# Patient Record
Sex: Female | Born: 1935 | Race: White | Hispanic: No | Marital: Married | Smoking: Never smoker
Health system: Southern US, Community
[De-identification: ages and names within clinical notes are randomized; demographics above are authoritative.]

## PROBLEM LIST (undated history)

## (undated) DIAGNOSIS — H269 Unspecified cataract: Secondary | ICD-10-CM

## (undated) HISTORY — PX: ABDOMINAL HYSTERECTOMY: SHX81

## (undated) HISTORY — DX: Unspecified cataract: H26.9

## (undated) HISTORY — PX: CHOLECYSTECTOMY: SHX55

## (undated) HISTORY — PX: APPENDECTOMY: SHX54

---

## 2000-11-18 ENCOUNTER — Other Ambulatory Visit: Admission: RE | Admit: 2000-11-18 | Discharge: 2000-11-18 | Payer: Self-pay | Admitting: Obstetrics and Gynecology

## 2001-01-04 ENCOUNTER — Encounter: Admission: RE | Admit: 2001-01-04 | Discharge: 2001-01-20 | Payer: Self-pay | Admitting: Specialist

## 2001-01-21 ENCOUNTER — Encounter: Admission: RE | Admit: 2001-01-21 | Discharge: 2001-04-21 | Payer: Self-pay | Admitting: Specialist

## 2001-12-23 ENCOUNTER — Encounter: Payer: Self-pay | Admitting: Interventional Cardiology

## 2001-12-23 ENCOUNTER — Inpatient Hospital Stay (HOSPITAL_COMMUNITY): Admission: EM | Admit: 2001-12-23 | Discharge: 2001-12-23 | Payer: Self-pay | Admitting: Emergency Medicine

## 2004-05-20 ENCOUNTER — Ambulatory Visit: Payer: Self-pay | Admitting: Family Medicine

## 2004-07-03 ENCOUNTER — Ambulatory Visit: Payer: Self-pay | Admitting: Family Medicine

## 2004-09-09 ENCOUNTER — Ambulatory Visit: Payer: Self-pay | Admitting: Family Medicine

## 2004-10-03 ENCOUNTER — Ambulatory Visit: Payer: Self-pay | Admitting: Family Medicine

## 2004-11-21 ENCOUNTER — Ambulatory Visit: Payer: Self-pay | Admitting: Family Medicine

## 2005-01-06 ENCOUNTER — Ambulatory Visit: Payer: Self-pay | Admitting: Family Medicine

## 2005-03-10 ENCOUNTER — Ambulatory Visit: Payer: Self-pay | Admitting: Family Medicine

## 2005-03-25 ENCOUNTER — Ambulatory Visit: Payer: Self-pay | Admitting: Family Medicine

## 2005-03-31 ENCOUNTER — Ambulatory Visit (HOSPITAL_COMMUNITY): Admission: RE | Admit: 2005-03-31 | Discharge: 2005-03-31 | Payer: Self-pay | Admitting: Family Medicine

## 2005-04-09 ENCOUNTER — Ambulatory Visit: Payer: Self-pay | Admitting: Family Medicine

## 2005-05-07 ENCOUNTER — Ambulatory Visit: Payer: Self-pay | Admitting: Family Medicine

## 2005-06-12 ENCOUNTER — Ambulatory Visit: Payer: Self-pay | Admitting: Family Medicine

## 2005-08-20 ENCOUNTER — Ambulatory Visit: Payer: Self-pay | Admitting: Family Medicine

## 2005-09-10 ENCOUNTER — Ambulatory Visit: Payer: Self-pay | Admitting: Family Medicine

## 2005-11-20 ENCOUNTER — Ambulatory Visit: Payer: Self-pay | Admitting: Family Medicine

## 2005-12-01 ENCOUNTER — Ambulatory Visit: Payer: Self-pay | Admitting: Family Medicine

## 2005-12-30 ENCOUNTER — Ambulatory Visit: Payer: Self-pay | Admitting: Family Medicine

## 2006-03-18 ENCOUNTER — Ambulatory Visit: Payer: Self-pay | Admitting: Family Medicine

## 2006-03-26 ENCOUNTER — Ambulatory Visit: Payer: Self-pay | Admitting: Family Medicine

## 2006-05-20 ENCOUNTER — Ambulatory Visit: Payer: Self-pay | Admitting: Family Medicine

## 2006-05-22 ENCOUNTER — Ambulatory Visit: Payer: Self-pay | Admitting: Family Medicine

## 2006-07-13 ENCOUNTER — Ambulatory Visit: Payer: Self-pay | Admitting: Family Medicine

## 2006-07-29 ENCOUNTER — Ambulatory Visit: Payer: Self-pay | Admitting: Family Medicine

## 2006-08-24 ENCOUNTER — Ambulatory Visit: Payer: Self-pay | Admitting: Family Medicine

## 2007-07-09 ENCOUNTER — Encounter: Admission: RE | Admit: 2007-07-09 | Discharge: 2007-07-09 | Payer: Self-pay | Admitting: Family Medicine

## 2009-01-09 IMAGING — CR DG FOOT COMPLETE 3+V*R*
3 series · 3 of 3 positions shown · non-contrast
Comparison: None.

RIGHT FOOT - 3  VIEW:

CLINICAL DATA: Right foot and ankle pain

[t foot ap right]
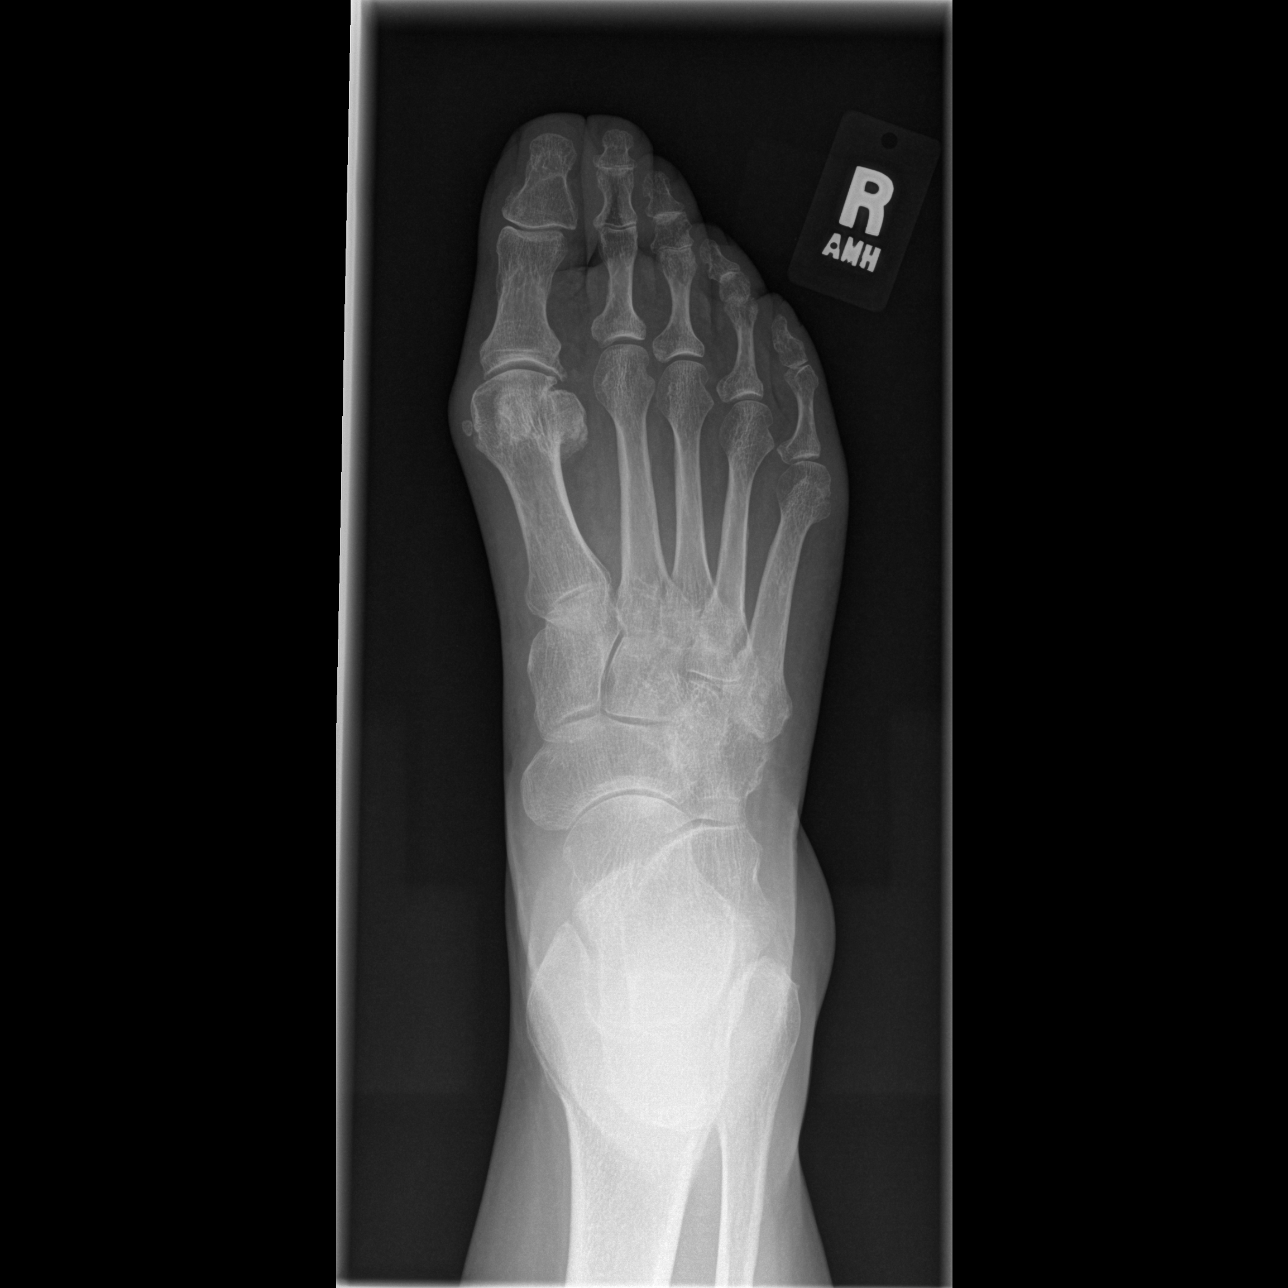

[t foot oblique right]
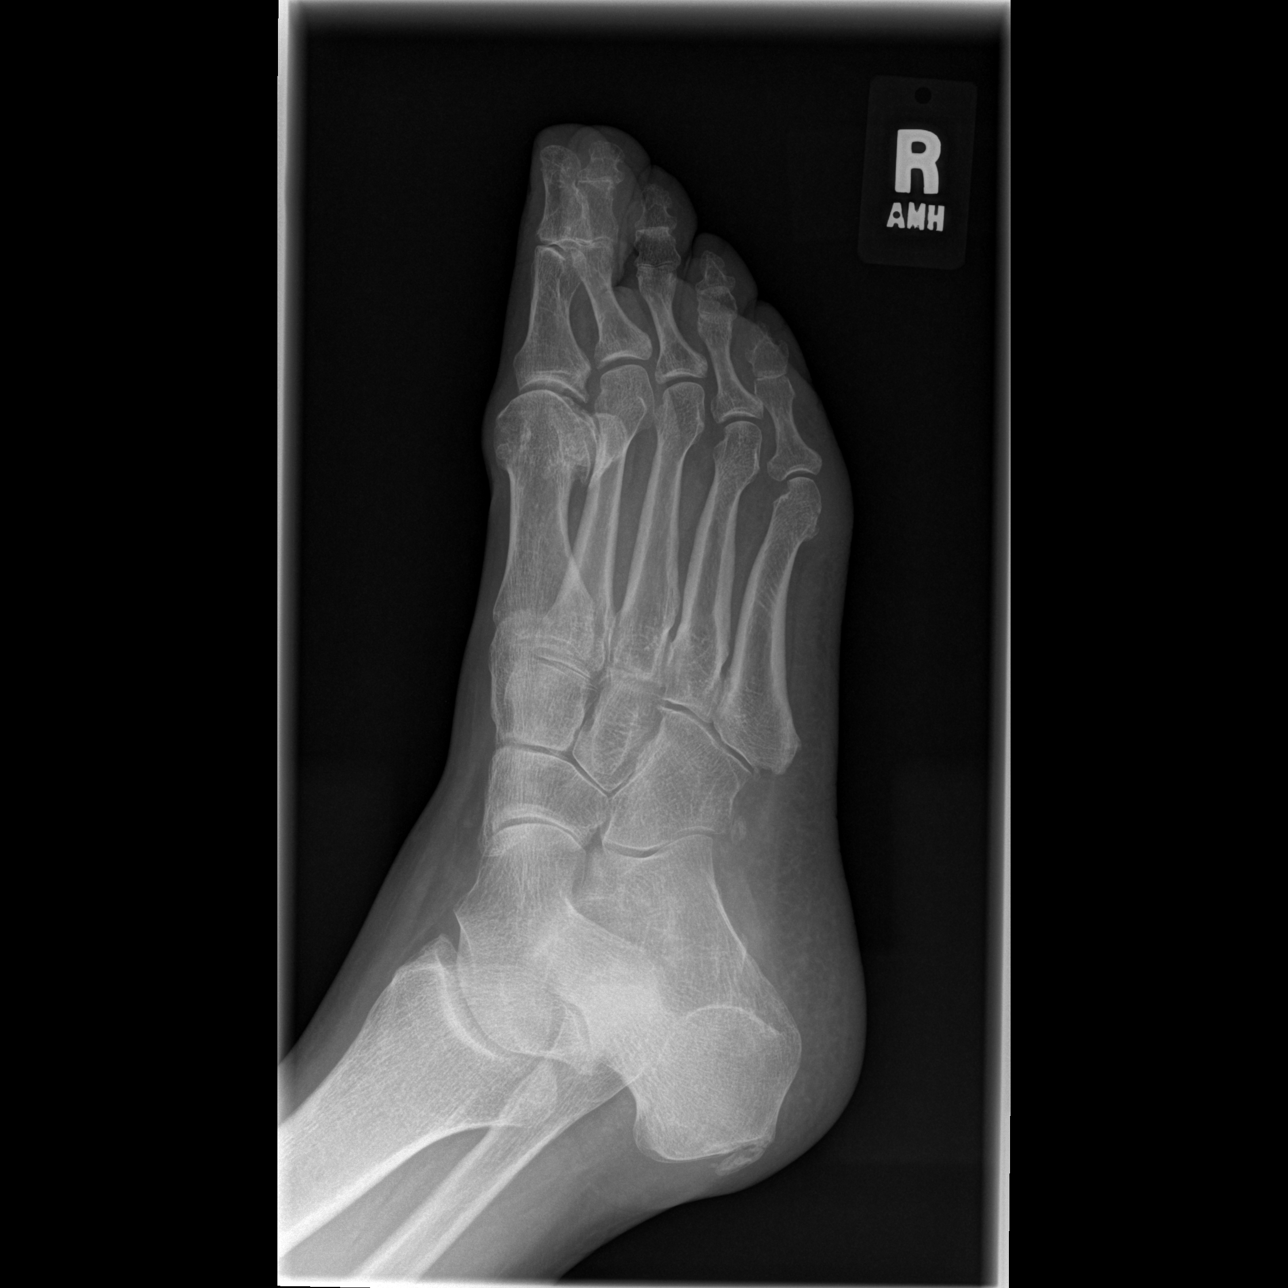

[t foot lat right]
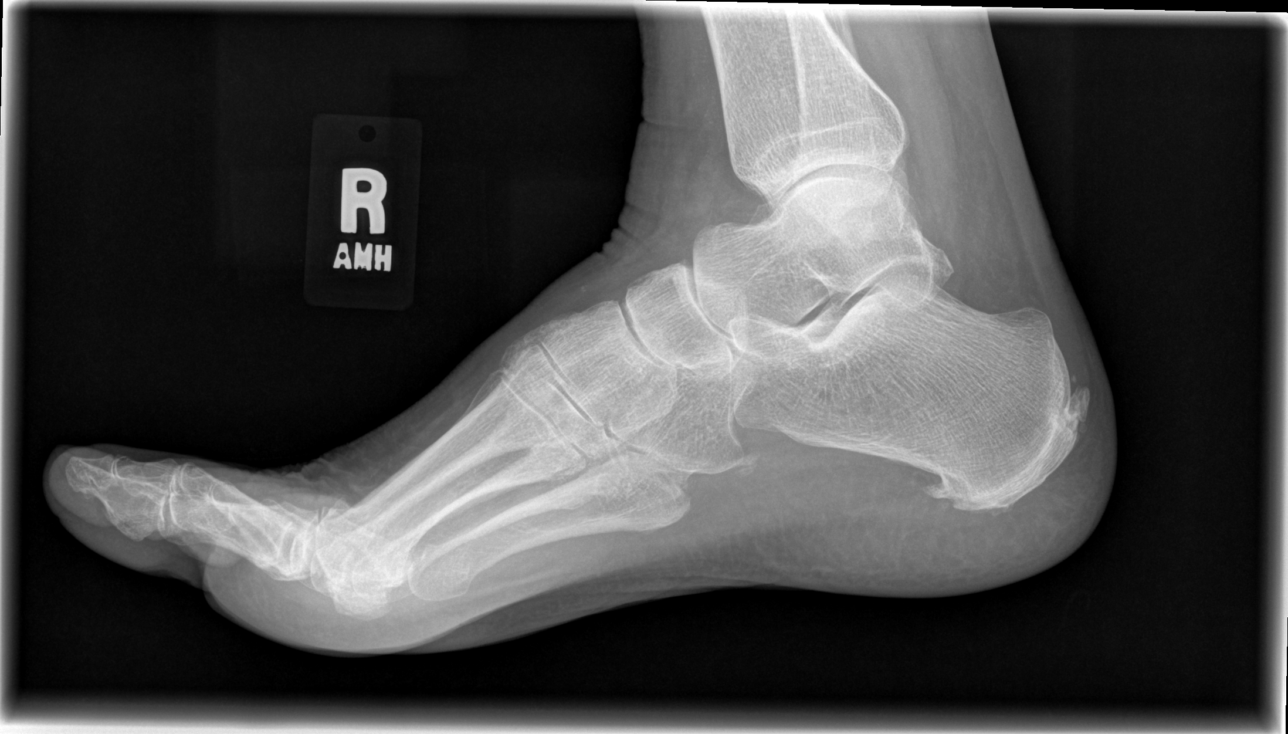

[3 of 3 positions shown; findings below may reference images not displayed]

FINDINGS: No evidence for an acute fracture. There is no dislocation. Bones are
diffusely demineralized. Hallux valgus deformity is evident and there are
degenerative changes at the first MTP joint. Small spurs extend from both the
plantar and Achilles aspects of the calcaneal tuberosity.
IMPRESSION: No acute bony abnormality.

## 2010-07-13 ENCOUNTER — Encounter: Payer: Self-pay | Admitting: Family Medicine

## 2010-11-08 NOTE — H&P (Signed)
[facility] - Inpatient  Patient:    Teresa Lowery, Teresa Lowery Visit Number: [id number] MRN: [id number]          Service Type: EMS Location: [facility] Attending Physician:  Hanley Seamen Dictated by:   Clovis Pu Patty Sermons, M.D. Admit Date:  12/22/2001   CC:         Darci Needle, M.D.  Colon Flattery, D.O., [place]   History and Physical  CHIEF COMPLAINT:  Uncontrolled high blood pressure and chest pain.  HISTORY:  This is a 76 year old, married, Caucasian woman admitted with chest pain and hypertension.  She was scheduled to see Dr. Verdis Prime for the first time on July 18 for what she describes as a "vascular workup."  The patient had an MRI approximately a week ago at [facility] and was told that she had "TIAs" and was started on Plavix.  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  She had a sensation of motion and vertigo. She had a blood pressure checked by a person at the blood mobile, and it was elevated in the range of 200/90.  She was advised to go to the emergency room which she did.  En route to the hospital, she developed substernal chest pain with nausea but no vomiting and no diaphoresis.  She has not had any significant dyspnea.  Of note is the fact that the patient was found to have high blood pressure about four years ago.  At that time, she was started on Covera HS 150 mg daily, and she has been on Covera HS since that time.  She has not tried other medicines. and up until now her blood pressure has been well controlled.  She does not give any history of exertional chest pain to suggest angina.  FAMILY HISTORY:  Her mother died of an MI in her 72s.  She has a brother who died at age 72 during a failed angioplasty attempt.  All of her maternal uncles have died in their 62s, 77s, or 56s of sudden cardiac death.  Her father died at age 34 of a pulmonary embolus after surgery.  SOCIAL HISTORY:  She is married.  She  has two children.  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  She has never smoked, and she does not drink any alcohol.  She avoids caffeine.  PRESENT MEDICATIONS: 1. Provera HS 180 mg daily. 2. Plavix 75 mg daily. 3. Allegra p.r.n. 4. Generic Tranxene p.r.n. 5. Premarin 0.625 mg daily.  ALLERGIES:  ASPIRIN, PREDNISONE, and SULFA.  PAST MEDICAL HISTORY:  She has had a hysterectomy, cholecystectomy, and tonsillectomy.  REVIEW OF SYSTEMS:  Gastrointestinal: Negative.  Genitourinary: Negative. Respiratory: No cough or bronchitis.  She does have arthritis in the hands. Neurologic: She has not had any headaches but has had recent vertigo. Endocrine: History reveals no diabetes or thyroid problems but has been told of slightly elevated cholesterol but not high enough to warrant drug treatment.  PHYSICAL EXAMINATION:  VITAL SIGNS:  Blood pressure 183/93, pulse 60 and regular, respirations normal.  HEENT:  Pupils are equal, round and reactive to light and accommodations. Extraocular movements are full.  She does have nystagmus to the right.  NECK:  Jugular venous pressure normal, carotids normal, thyroid normal.  CHEST:  Clear.  HEART:  Reveals a loud aortic closure sound.  There is no gallop, murmur, or rub.  ABDOMEN:  Soft without hepatosplenomegaly or masses.  There are no abdominal  bruits audible.  EXTREMITIES:  Trace edema.  There are good peripheral pulses.  NEUROLOGIC:  Nonfocal.  DIAGNOSTIC DATA:  Electrocardiogram showed normal sinus rhythm and is within normal limits.  Chest x-ray shows mild cardiomegaly and clear lungs.  Labs include normal CBC, normal troponin, normal CK-MB.  Sodium 138, potassium 3.4, blood sugar 107, BUN 11, creatinine 0.8.  Liver function studies normal.  IMPRESSION: 1. Chest pain, rule out myocardial infarction. 2. Uncontrolled hypertension. 3. Dizziness. 4. Recent diagnosis of transient ischemic attacks. 5. Postmenopausal on  Premarin.  DISPOSITION:  We are going to admit to telemetry.  Will give IV nitroglycerin to help with blood pressure control as well as treat the chest pain.  Will add a beta blocker, hydrochlorothiazide, and low-dose ACE inhibitor.   Wills top the Covera HS at this point.  We will keep her on a caffeine-free diet and consider a possible Cardiolite stress test versus catheterization in a.m. after Dr. Katrinka Blazing sees her. Dictated by:   Clovis Pu Patty Sermons, M.D. Attending Physician:  Hanley Seamen DD:  12/23/01 TD:  12/23/01 Job: [id number]

## 2013-02-20 ENCOUNTER — Ambulatory Visit (INDEPENDENT_AMBULATORY_CARE_PROVIDER_SITE_OTHER): Payer: Medicare Other | Admitting: Family Medicine

## 2013-02-20 ENCOUNTER — Other Ambulatory Visit: Payer: Self-pay | Admitting: *Deleted

## 2013-02-20 VITALS — BP 128/68 | HR 68 | Temp 97.7°F | Resp 18 | Ht 65.5 in | Wt 177.0 lb

## 2013-02-20 DIAGNOSIS — N390 Urinary tract infection, site not specified: Secondary | ICD-10-CM

## 2013-02-20 DIAGNOSIS — R109 Unspecified abdominal pain: Secondary | ICD-10-CM

## 2013-02-20 DIAGNOSIS — H8110 Benign paroxysmal vertigo, unspecified ear: Secondary | ICD-10-CM

## 2013-02-20 LAB — POCT URINALYSIS DIPSTICK
Bilirubin, UA: NEGATIVE
Glucose, UA: NEGATIVE
Ketones, UA: NEGATIVE
Nitrite, UA: NEGATIVE
Protein, UA: NEGATIVE
Spec Grav, UA: 1.015
Urobilinogen, UA: 0.2
pH, UA: 5.5

## 2013-02-20 LAB — POCT UA - MICROSCOPIC ONLY
Casts, Ur, LPF, POC: NEGATIVE
Crystals, Ur, HPF, POC: NEGATIVE
Renal tubular cells: POSITIVE
Yeast, UA: NEGATIVE

## 2013-02-20 LAB — POCT CBC
Granulocyte percent: 56.8 %G (ref 37–80)
HCT, POC: 43.6 % (ref 37.7–47.9)
Hemoglobin: 13.9 g/dL (ref 12.2–16.2)
Lymph, poc: 2.5 (ref 0.6–3.4)
MCH, POC: 27.5 pg (ref 27–31.2)
MCHC: 31.9 g/dL (ref 31.8–35.4)
MCV: 86.1 fL (ref 80–97)
MID (cbc): 0.5 (ref 0–0.9)
MPV: 9 fL (ref 0–99.8)
POC Granulocyte: 3.9 (ref 2–6.9)
POC LYMPH PERCENT: 36 %L (ref 10–50)
POC MID %: 7.2 %M (ref 0–12)
Platelet Count, POC: 186 10*3/uL (ref 142–424)
RBC: 5.06 M/uL (ref 4.04–5.48)
RDW, POC: 14.8 %
WBC: 6.9 10*3/uL (ref 4.6–10.2)

## 2013-02-20 MED ORDER — MECLIZINE HCL 25 MG PO TABS: 25.0000 mg | ORAL_TABLET | Freq: Three times a day (TID) | ORAL | Status: AC | PRN

## 2013-02-20 MED ORDER — SULFAMETHOXAZOLE-TMP DS 800-160 MG PO TABS: 1.0000 | ORAL_TABLET | Freq: Two times a day (BID) | ORAL | Status: DC

## 2013-02-20 MED ORDER — NITROFURANTOIN MONOHYD MACRO 100 MG PO CAPS: 100.0000 mg | ORAL_CAPSULE | Freq: Two times a day (BID) | ORAL | Status: DC

## 2013-02-20 NOTE — Patient Instructions (Addendum)
Urinary Tract Infection Urinary tract infections (UTIs) can develop anywhere along your urinary tract. Your urinary tract is your body's drainage system for removing wastes and extra water. Your urinary tract includes two kidneys, two ureters, a bladder, and a urethra. Your kidneys are a pair of bean-shaped organs. Each kidney is about the size of your fist. They are located below your ribs, one on each side of your spine. CAUSES Infections are caused by microbes, which are microscopic organisms, including fungi, viruses, and bacteria. These organisms are so small that they can only be seen through a microscope. Bacteria are the microbes that most commonly cause UTIs. SYMPTOMS  Symptoms of UTIs may vary by age and gender of the patient and by the location of the infection. Symptoms in young women typically include a frequent and intense urge to urinate and a painful, burning feeling in the bladder or urethra during urination. Older women and men are more likely to be tired, shaky, and weak and have muscle aches and abdominal pain. A fever may mean the infection is in your kidneys. Other symptoms of a kidney infection include pain in your back or sides below the ribs, nausea, and vomiting. DIAGNOSIS To diagnose a UTI, your caregiver will ask you about your symptoms. Your caregiver also will ask to provide a urine sample. The urine sample will be tested for bacteria and white blood cells. White blood cells are made by your body to help fight infection. TREATMENT  Typically, UTIs can be treated with medication. Because most UTIs are caused by a bacterial infection, they usually can be treated with the use of antibiotics. The choice of antibiotic and length of treatment depend on your symptoms and the type of bacteria causing your infection. HOME CARE INSTRUCTIONS  If you were prescribed antibiotics, take them exactly as your caregiver instructs you. Finish the medication even if you feel better after you  have only taken some of the medication.  Drink enough water and fluids to keep your urine clear or pale yellow.  Avoid caffeine, tea, and carbonated beverages. They tend to irritate your bladder.  Empty your bladder often. Avoid holding urine for long periods of time.  Empty your bladder before and after sexual intercourse.  After a bowel movement, women should cleanse from front to back. Use each tissue only once. SEEK MEDICAL CARE IF:   You have back pain.  You develop a fever.  Your symptoms do not begin to resolve within 3 days. SEEK IMMEDIATE MEDICAL CARE IF:   You have severe back pain or lower abdominal pain.  You develop chills.  You have nausea or vomiting.  You have continued burning or discomfort with urination. MAKE SURE YOU:   Understand these instructions.  Will watch your condition.  Will get help right away if you are not doing well or get worse. Document Released: 03/19/2005 Document Revised: 12/09/2011 Document Reviewed: 07/18/2011 [facility] Patient Information 2014 [place], [place]. Benign Positional Vertigo Vertigo means you feel like you or your surroundings are moving when they are not. Benign positional vertigo is the most common form of vertigo. Benign means that the cause of your condition is not serious. Benign positional vertigo is more common in older adults. CAUSES  Benign positional vertigo is the result of an upset in the labyrinth system. This is an area in the middle ear that helps control your balance. This may be caused by a viral infection, head injury, or repetitive motion. However, often no specific cause is found.  SYMPTOMS  Symptoms of benign positional vertigo occur when you move your head or eyes in different directions. Some of the symptoms may include:  Loss of balance and falls.  Vomiting.  Blurred vision.  Dizziness.  Nausea.  Involuntary eye movements (nystagmus). DIAGNOSIS  Benign positional vertigo is usually  diagnosed by physical exam. If the specific cause of your benign positional vertigo is unknown, your caregiver may perform imaging tests, such as magnetic resonance imaging (MRI) or computed tomography (CT). TREATMENT  Your caregiver may recommend movements or procedures to correct the benign positional vertigo. Medicines such as meclizine, benzodiazepines, and medicines for nausea may be used to treat your symptoms. In rare cases, if your symptoms are caused by certain conditions that affect the inner ear, you may need surgery. HOME CARE INSTRUCTIONS   Follow your caregiver's instructions.  Move slowly. Do not make sudden body or head movements.  Avoid driving.  Avoid operating heavy machinery.  Avoid performing any tasks that would be dangerous to you or others during a vertigo episode.  Drink enough fluids to keep your urine clear or pale yellow. SEEK IMMEDIATE MEDICAL CARE IF:   You develop problems with walking, weakness, numbness, or using your arms, hands, or legs.  You have difficulty speaking.  You develop severe headaches.  Your nausea or vomiting continues or gets worse.  You develop visual changes.  Your family or friends notice any behavioral changes.  Your condition gets worse.  You have a fever.  You develop a stiff neck or sensitivity to light. MAKE SURE YOU:   Understand these instructions.  Will watch your condition.  Will get help right away if you are not doing well or get worse. Document Released: 03/17/2006 Document Revised: 09/01/2011 Document Reviewed: 02/27/2011 [facility] Patient Information 2014 [place], [place].

## 2013-02-20 NOTE — Progress Notes (Signed)
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  No prior h/o vertigo.  No fever.  No significant loss of hearing.    Patient also has lower abdominal pain several months.  She feels like some swelling RLQ.  She's had a colonoscopy within 10 years.  Associated:  Nausea  Objective:  NAD HEENT:  Small amount of fluid right TM. Chest:  Clear Heart:  Reg, no murmur Neck:  No bruit or adenopathy Abdomen:  Tender both lower quadrants with slight rebound, soft, no mass Ext:  Good pulses, no edema Skin:  No rash Results for orders placed in visit on 02/20/13  POCT CBC      Result Value Range   WBC 6.9  4.6 - 10.2 K/uL   Lymph, poc 2.5  0.6 - 3.4   POC LYMPH PERCENT 36.0  10 - 50 %L   MID (cbc) 0.5  0 - 0.9   POC MID % 7.2  0 - 12 %M   POC Granulocyte 3.9  2 - 6.9   Granulocyte percent 56.8  37 - 80 %G   RBC 5.06  4.04 - 5.48 M/uL   Hemoglobin 13.9  12.2 - 16.2 g/dL   HCT, POC 40.9  81.1 - 47.9 %   MCV 86.1  80 - 97 fL   MCH, POC 27.5  27 - 31.2 pg   MCHC 31.9  31.8 - 35.4 g/dL   RDW, POC 91.4     Platelet Count, POC 186  142 - 424 K/uL   MPV 9.0  0 - 99.8 fL  POCT UA - MICROSCOPIC ONLY      Result Value Range   WBC, Ur, HPF, POC 7-19     RBC, urine, microscopic 4-12     Bacteria, U Microscopic 1+     Mucus, UA trace     Epithelial cells, urine per micros 5-21     Crystals, Ur, HPF, POC neg     Casts, Ur, LPF, POC neg     Yeast, UA neg     Renal tubular cells pos    POCT URINALYSIS DIPSTICK      Result Value Range   Color, UA yellow     Clarity, UA clear     Glucose, UA neg     Bilirubin, UA neg     Ketones, UA neg     Spec Grav, UA 1.015     Blood, UA small     pH, UA 5.5     Protein, UA neg     Urobilinogen, UA 0.2     Nitrite, UA neg     Leukocytes, UA small (1+)     Abdominal  pain, other specified site - Plan: POCT CBC, POCT UA - Microscopic Only, POCT urinalysis dipstick, DISCONTINUED:  sulfamethoxazole-trimethoprim (BACTRIM DS) 800-160 MG per tablet  UTI (lower urinary tract infection) - Plan: Urine culture, DISCONTINUED: sulfamethoxazole-trimethoprim (BACTRIM DS) 800-160 MG per tablet  Benign paroxysmal positional vertigo - Plan: meclizine (ANTIVERT) 25 MG tablet  Signed, Elvina Sidle, MD

## 2013-02-21 LAB — URINE CULTURE
Colony Count: NO GROWTH
Organism ID, Bacteria: NO GROWTH

## 2013-03-05 ENCOUNTER — Ambulatory Visit (INDEPENDENT_AMBULATORY_CARE_PROVIDER_SITE_OTHER): Payer: Medicare Other | Admitting: Family Medicine

## 2013-03-05 VITALS — BP 128/72 | HR 60 | Temp 97.6°F | Resp 16 | Wt 182.4 lb

## 2013-03-05 DIAGNOSIS — N760 Acute vaginitis: Secondary | ICD-10-CM

## 2013-03-05 DIAGNOSIS — R300 Dysuria: Secondary | ICD-10-CM

## 2013-03-05 DIAGNOSIS — B9689 Other specified bacterial agents as the cause of diseases classified elsewhere: Secondary | ICD-10-CM

## 2013-03-05 DIAGNOSIS — N898 Other specified noninflammatory disorders of vagina: Secondary | ICD-10-CM

## 2013-03-05 DIAGNOSIS — R1031 Right lower quadrant pain: Secondary | ICD-10-CM

## 2013-03-05 LAB — POCT UA - MICROSCOPIC ONLY
Casts, Ur, LPF, POC: NEGATIVE
Crystals, Ur, HPF, POC: NEGATIVE
Mucus, UA: POSITIVE
Yeast, UA: NEGATIVE

## 2013-03-05 LAB — POCT URINALYSIS DIPSTICK
Bilirubin, UA: NEGATIVE
Glucose, UA: NEGATIVE
Ketones, UA: NEGATIVE
Nitrite, UA: NEGATIVE
Protein, UA: NEGATIVE
Spec Grav, UA: 1.02
Urobilinogen, UA: 0.2
pH, UA: 7

## 2013-03-05 LAB — POCT WET PREP WITH KOH
KOH Prep POC: NEGATIVE
Trichomonas, UA: NEGATIVE
Yeast Wet Prep HPF POC: NEGATIVE

## 2013-03-05 MED ORDER — METRONIDAZOLE 500 MG PO TABS: 500.0000 mg | ORAL_TABLET | Freq: Two times a day (BID) | ORAL | Status: DC

## 2013-03-05 MED ORDER — FLUCONAZOLE 150 MG PO TABS: 150.0000 mg | ORAL_TABLET | Freq: Once | ORAL | Status: DC

## 2013-03-05 NOTE — Progress Notes (Signed)
[facility]:  Office Visit  Chief Complaint:  Chief Complaint  Patient presents with  . Fatigue  . Abdominal Pain    LLQ- uncomfortable  . Hip Pain  . Back Pain  . Vaginitis    HPI: Teresa Lowery is a 78 y.o. female who complains of   1. Lower right sided abd and pelvic  "discomfort, not pain" for 5-6 months, feels nauseated all the time; she denies vomiting, , denies fevers, chills, night sweats, weight loss. She has had cholecystectomy and also appendectomy. She has had hysterectomy. Denies diabetes. She has had flatus and also regular  BMs. Denies skn changes/rash/viral illness. She has had dysuria nd vaginal itching. 2. Vaginal itching, and raw, and burning and stinging and has been going on "forever" greater than 3-4 months. Denies STDs.  Past Medical History  Diagnosis Date  . Cataract    Past Surgical History  Procedure Laterality Date  . Abdominal hysterectomy    . Cholecystectomy    . Appendectomy     History   Social History  . Marital Status: Married    Spouse Name: N/A    Number of Children: N/A  . Years of Education: N/A   Social History Main Topics  . Smoking status: Never Smoker   . Smokeless tobacco: Never Used  . Alcohol Use: None  . Drug Use: None  . Sexual Activity: None   Other Topics Concern  . None   Social History Narrative  . None   No family history on file. Allergies  Allergen Reactions  . Aspirin   . Penicillins   . Sulfa Antibiotics    Prior to Admission medications   Medication Sig Start Date End Date Taking? Authorizing Provider  clorazepate (TRANXENE) 7.5 MG tablet Take 7.5 mg by mouth daily.   Yes Historical Provider, MD  cyanocobalamin (,VITAMIN B-12,) 1000 MCG/ML injection Inject 1,000 mcg into the muscle every 30 (thirty) days.   Yes Historical Provider, MD  olmesartan-hydrochlorothiazide (BENICAR HCT) 20-12.5 MG per tablet Take 1 tablet by mouth daily.   Yes Historical Provider, MD  potassium  chloride (KLOR-CON) 20 MEQ packet Take 20 mEq by mouth daily.   Yes Historical Provider, MD  meclizine (ANTIVERT) 25 MG tablet Take 1 tablet (25 mg total) by mouth 3 (three) times daily as needed. 02/20/13   Elvina Sidle, MD  nitrofurantoin, macrocrystal-monohydrate, (MACROBID) 100 MG capsule Take 1 capsule (100 mg total) by mouth 2 (two) times daily. For 7 days 02/20/13   Elvina Sidle, MD  Vitamin D, Ergocalciferol, (DRISDOL) 50000 UNITS CAPS capsule Take 50,000 Units by mouth every 7 (seven) days.    Historical Provider, MD     ROS: The patient denies fevers, chills, night sweats, unintentional weight loss, chest pain, palpitations, wheezing, dyspnea on exertion, nausea, vomiting,  hematuria, melena, numbness, weakness, or tingling.   All other systems have been reviewed and were otherwise negative with the exception of those mentioned in the HPI and as above.    PHYSICAL EXAM: Filed Vitals:   03/05/13 1429  BP: 128/72  Pulse: 60  Temp: 97.6 F (36.4 C)  Resp: 16   Filed Vitals:   03/05/13 1429  Weight: 182 lb 6.4 oz (82.736 kg)   Body mass index is 29.88 kg/(m^2).  General: Alert, no acute distress HEENT:  Normocephalic, atraumatic, oropharynx patent. EOMI, PERRLA Cardiovascular:  Regular rate and rhythm, no rubs murmurs or gallops.  No Carotid bruits, radial pulse intact. No pedal edema.  Respiratory: Clear to auscultation bilaterally.  No wheezes, rales, or rhonchi.  No cyanosis, no use of accessory musculature GI: No organomegaly, abdomen is soft and diffusely -tender R abd mid and Lower right pelvic area, positive bowel sounds.  No masses. Skin: No rashes. Neurologic: Facial musculature symmetric. Psychiatric: Patient is appropriate throughout our interaction. Lymphatic: No cervical lymphadenopathy Musculoskeletal: Gait intact. Vaginal exam-no masses,lesions, + thick white dc  LABS: Results for orders placed in visit on 03/05/13  POCT UA - MICROSCOPIC ONLY       Result Value Range   WBC, Ur, HPF, POC 1-2     RBC, urine, microscopic 8-10     Bacteria, U Microscopic 1+     Mucus, UA pos     Epithelial cells, urine per micros 5-6     Crystals, Ur, HPF, POC neg     Casts, Ur, LPF, POC neg     Yeast, UA neg    POCT URINALYSIS DIPSTICK      Result Value Range   Color, UA yellow     Clarity, UA clear     Glucose, UA neg     Bilirubin, UA neg     Ketones, UA neg     Spec Grav, UA 1.020     Blood, UA mod     pH, UA 7.0     Protein, UA neg     Urobilinogen, UA 0.2     Nitrite, UA neg     Leukocytes, UA Trace    POCT WET PREP WITH KOH      Result Value Range   Trichomonas, UA Negative     Clue Cells Wet Prep HPF POC 18-30     Epithelial Wet Prep HPF POC 4-10     Yeast Wet Prep HPF POC neg     Bacteria Wet Prep HPF POC 2+     RBC Wet Prep HPF POC 1-4     WBC Wet Prep HPF POC 3-5     KOH Prep POC Negative       EKG/XRAY:   Primary read interpreted by Dr. Conley Rolls at [facility].   ASSESSMENT/PLAN: Encounter Diagnoses  Name Primary?  . Vaginitis and vulvovaginitis Yes  . Bacterial vaginitis   . Vaginal discharge   . Dysuria   . Abdominal pain, lower, right    Will get abd [place] since she has been having sxs for last 6 months Rx Flagyl, Diflucan for vaginitis at this time F/u prn Gross sideeffects, risk and benefits, and alternatives of medications d/w patient. Patient is aware that all medications have potential sideeffects and we are unable to predict every sideeffect or drug-drug interaction that may occur.  Hamilton Capri PHUONG, DO 03/05/2013 4:33 PM

## 2013-03-05 NOTE — Patient Instructions (Addendum)
Vaginitis  Vaginitis in a soreness, swelling and redness (inflammation) of the vagina and vulva. This is not a sexually transmitted infection.   CAUSES   Yeast vaginitis is caused by yeast (candida) that is normally found in your vagina. With a yeast infection, the candida has over grown in number to a point that upsets the chemical balance.  SYMPTOMS    White thick vaginal discharge.   Swelling, itching, redness and irritation of the vagina and possibly the lips of the vagina (vulva).   Burning or painful urination.   Painful intercourse.  HOME CARE INSTRUCTIONS    Finish all medication as prescribed.   Do not have sex until treatment is completed or instructed by your healthcare giver.   Take warm sitz baths.   Do not douche.   Do not use tampons, especially scented ones.   Wear cotton underwear.   Avoid tight pants and panty hose.   Tell your sexual partner that you have a yeast infection. They should go to their caregiver if they have symptoms such as mild rash or itching.   Your sexual partner should be treated if your infection is difficult to eliminate.   Practice safer sex. Use condoms.   Some vaginal medications cause latex condoms to fail. Ask your caregiver this.  SEEK MEDICAL CARE IF:    You develop a fever.   The infection is getting worse after 2 days of treatment.   The infection is not getting better after 3 days of treatment.   You develop blisters in or around your vagina.   You develop vaginal bleeding, and it is not your menstrual period.   You have pain when you urinate.   You develop intestinal problems.   You have pain with sexual intercourse.  Document Released: 07/17/2004 Document Revised: 05/29/2011 Document Reviewed: 02/22/2009  [employer] Patient Information 2012 [employer].

## 2013-03-07 LAB — URINE CULTURE: Colony Count: 15000

## 2013-03-10 ENCOUNTER — Other Ambulatory Visit: Payer: Self-pay | Admitting: Radiology

## 2013-03-10 DIAGNOSIS — R102 Pelvic and perineal pain: Secondary | ICD-10-CM

## 2013-03-17 ENCOUNTER — Ambulatory Visit
Admission: RE | Admit: 2013-03-17 | Discharge: 2013-03-17 | Disposition: A | Discharge status: Home / Self Care | Payer: Self-pay | Source: Ambulatory Visit | Attending: Family Medicine | Admitting: Family Medicine

## 2013-03-17 ENCOUNTER — Ambulatory Visit
Admission: RE | Admit: 2013-03-17 | Discharge: 2013-03-17 | Disposition: A | Discharge status: Home / Self Care | Payer: Medicare Other | Source: Ambulatory Visit | Attending: Family Medicine | Admitting: Family Medicine

## 2013-03-17 ENCOUNTER — Telehealth: Payer: Self-pay | Admitting: Family Medicine

## 2013-03-17 DIAGNOSIS — R102 Pelvic and perineal pain: Secondary | ICD-10-CM

## 2013-03-17 NOTE — Telephone Encounter (Signed)
Spoke to patient about [place] results, she will f/u with PCP, I will send her all test results and imaging studies.

## 2013-03-21 ENCOUNTER — Telehealth: Payer: Self-pay | Admitting: Radiology

## 2013-03-21 NOTE — Telephone Encounter (Signed)
Patient has requested copies of imaging studies, she will have to get these from [facility]. I have called her to advise.

## 2013-03-22 ENCOUNTER — Telehealth: Payer: Self-pay

## 2013-03-22 NOTE — Telephone Encounter (Signed)
PT STATES SHE THINK AMY MAY HAVE CALLED HER YESTERDAY AND SHE IS RETURNING THE CALL. WILL BE HOME UNTIL 2:00. PLEASE CALL [phone]

## 2013-03-22 NOTE — Telephone Encounter (Signed)
Called her again to advise.

## 2014-01-20 ENCOUNTER — Encounter: Payer: Self-pay | Admitting: Gastroenterology

## 2014-09-18 IMAGING — US US ABDOMEN COMPLETE
1 series · 13 of 25 positions shown · non-contrast
Comparison: CT report 07/22/2006 no images available

CLINICAL DATA: Abdominal and pelvic pain for 6 months, status post
cholecystectomy

EXAM:
ABDOMEN ULTRASOUND

[Series 1: us abdomen complete · 0.27mm/px · 13 of 75 slices shown]
[im 1/75]
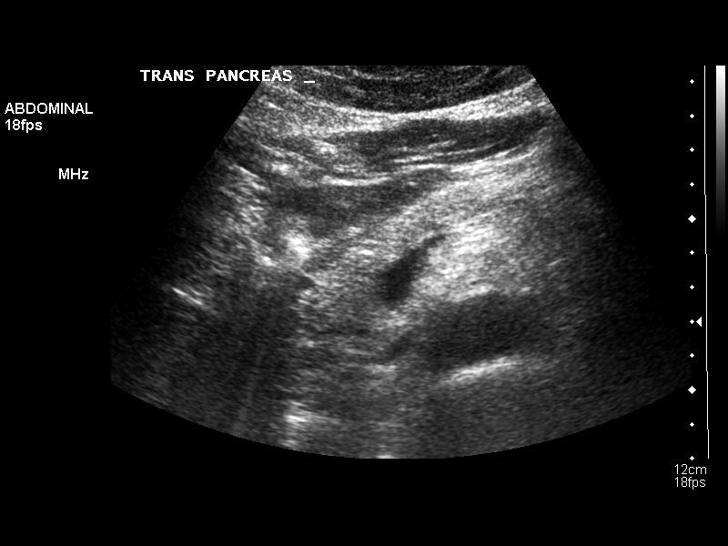
[im 7/75]
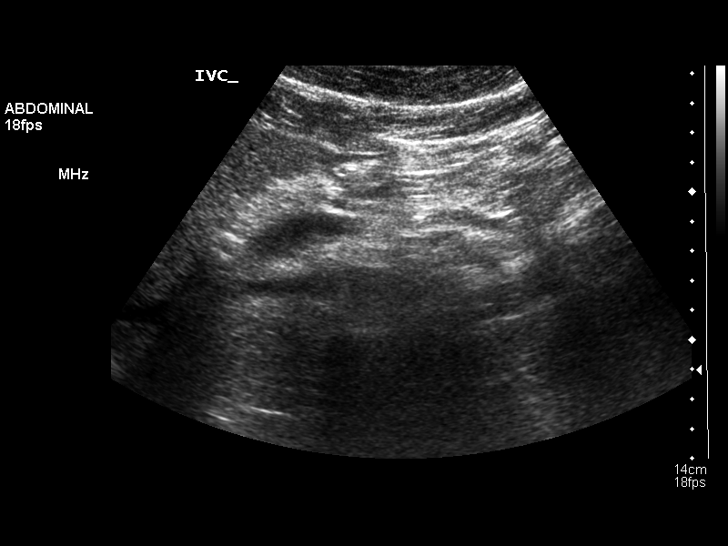
[im 13/75]
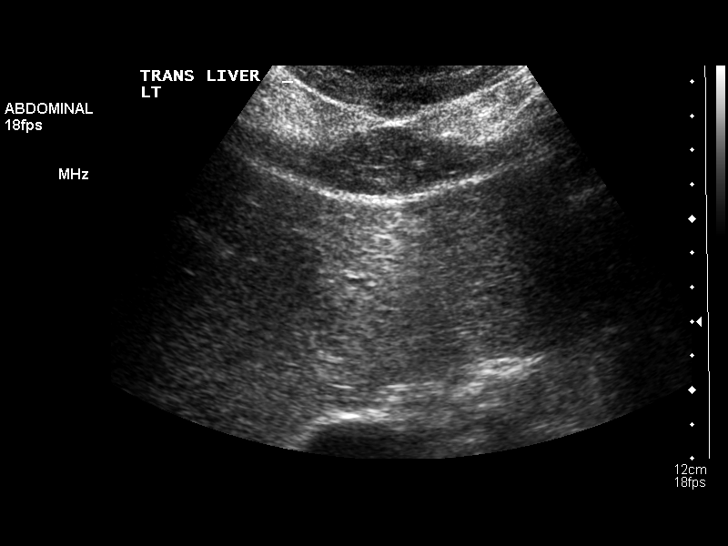
[im 19/75]
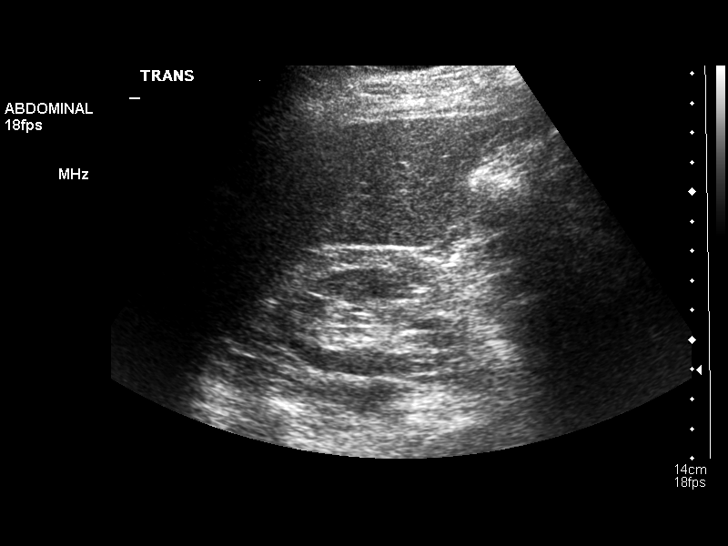
[im 25/75]
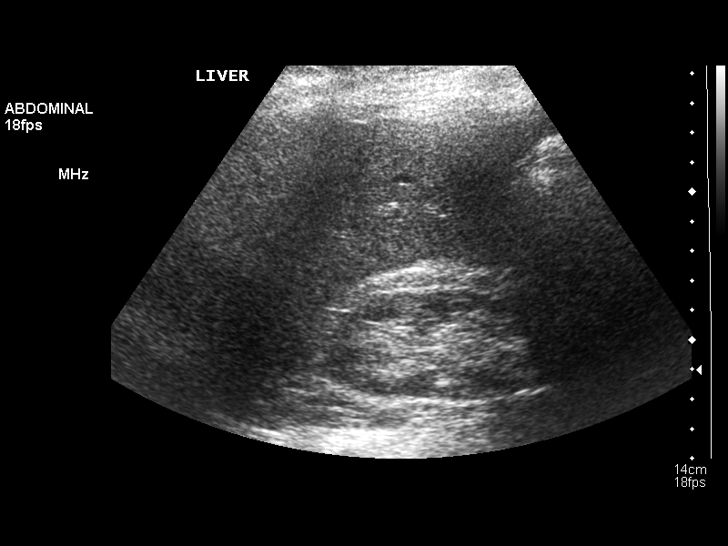
[im 31/75]
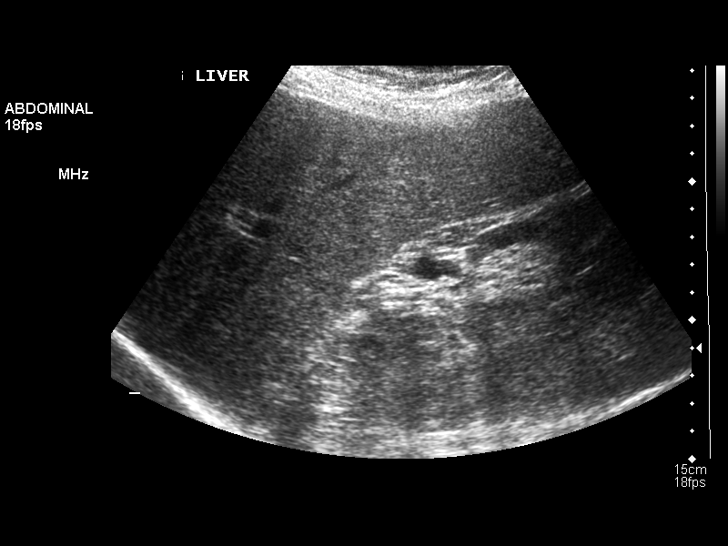
[im 38/75]
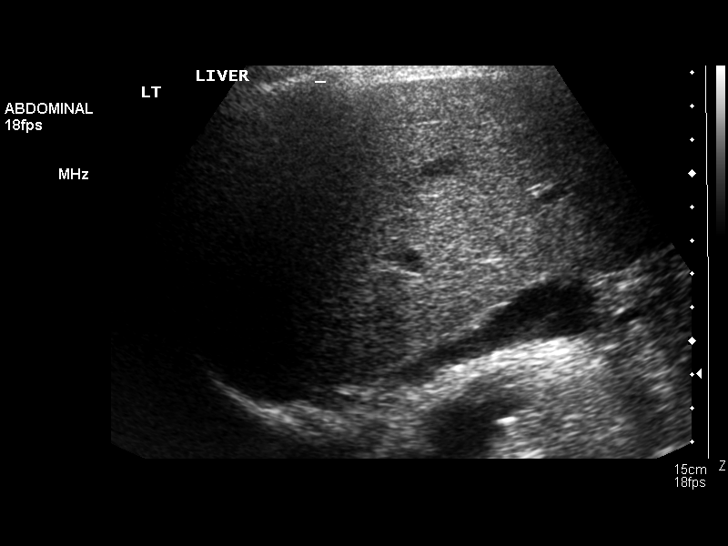
[im 44/75]
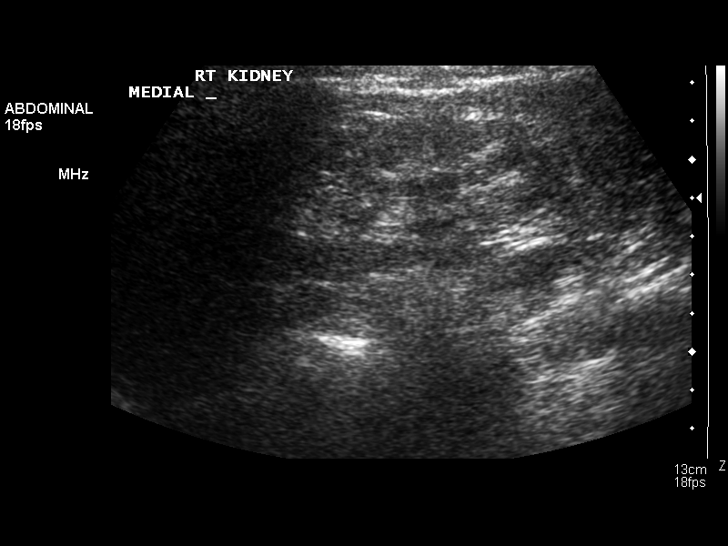
[im 50/75]
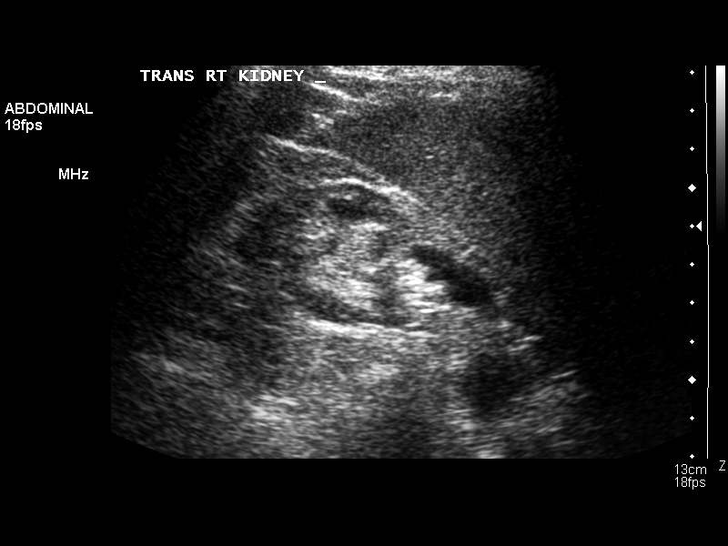
[im 56/75]
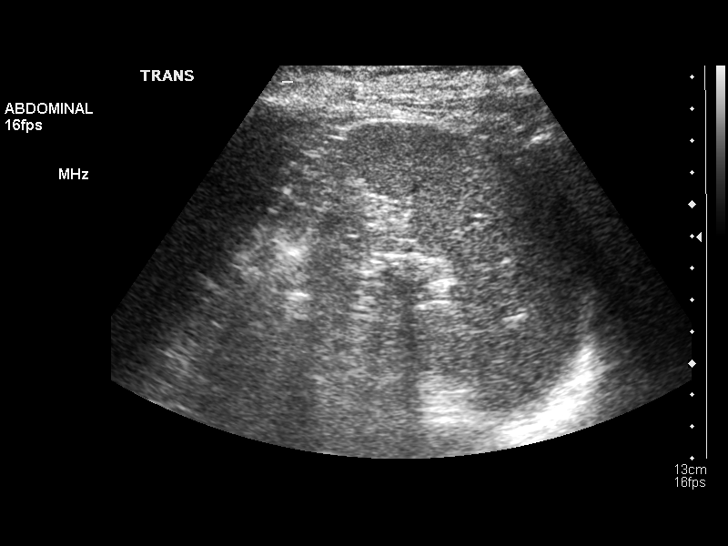
[im 62/75]
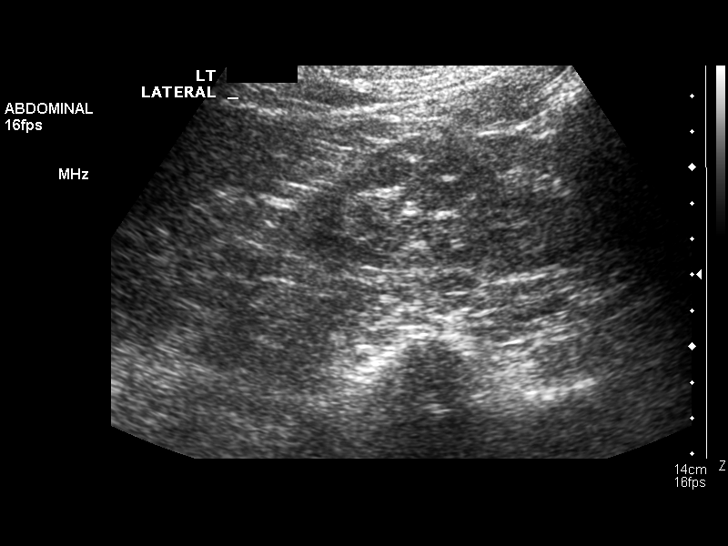
[im 68/75]
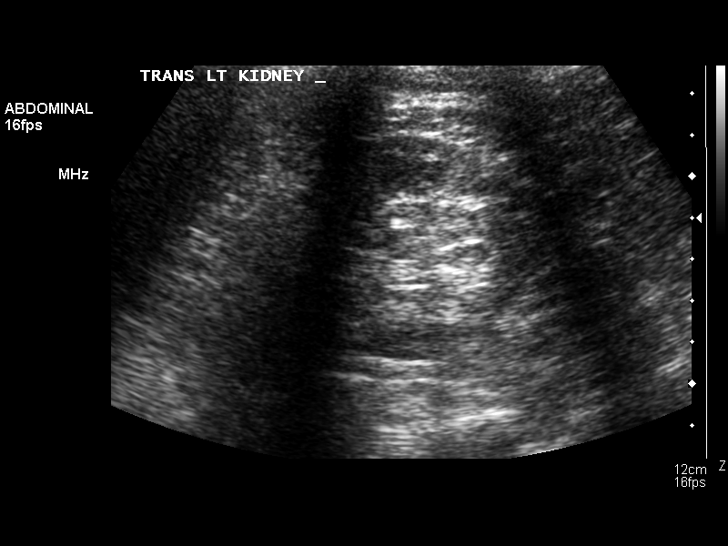
[im 75/75]
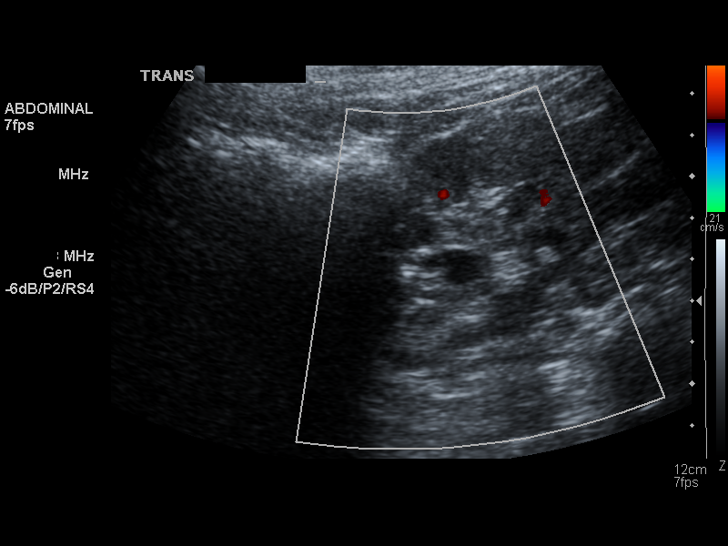

[13 of 25 positions shown; findings below may reference images not displayed]

FINDINGS: Gallbladder

Surgically absent

Common bile duct

Diameter: 3.6 mm

Liver

No focal hepatic mass noted. Mild increased echogenicity of the
liver suspicious for fatty infiltration. No intrahepatic biliary
ductal dilatation.

IVC

No abnormality visualized.

Pancreas

Visualized portion unremarkable.

Spleen

Size and appearance within normal limits. Measures 7.4 cm in length.

Right Kidney

Length: 10.6 cm Echogenicity within normal limits. No mass or
hydronephrosis visualized.

Left Kidney

Length: 11.2 cm Echogenicity within normal limits. No mass or
hydronephrosis visualized. Mild cortical thinning bilateral kidney
probable due to mild atrophy. There is a probable parapelvic cyst
left kidney measures 1.4 cm.

Abdominal aorta

No aneurysm visualized. Measures up to 2.6 cm in diameter.
Atherosclerotic calcifications of aortic wall.
IMPRESSION: 1. Surgically absent gallbladder. Normal CBD.
2. Mild increased echogenicity of the liver suspicious for fatty
infiltration. No focal hepatic mass.
3. No hydronephrosis or diagnostic renal calculus. Bilateral renal
mild cortical thinning probable due to atrophy.
4. No aortic aneurysm.

## 2014-09-18 IMAGING — US US PELVIS COMPLETE
1 series · 14 of 25 positions shown · non-contrast
Comparison: 03/12/2004

CLINICAL DATA: Pelvic pain in a female. Patient describes right
lower quadrant pain. She has a history of a hysterectomy and
bilateral oophorectomy.

EXAM:
TRANSABDOMINAL AND TRANSVAGINAL ULTRASOUND OF PELVIS
TECHNIQUE: Both transabdominal and transvaginal ultrasound examinations of the
pelvis were performed. Transabdominal technique was performed for
global imaging of the pelvis including uterus, ovaries, adnexal
regions, and pelvic cul-de-sac. It was necessary to proceed with
endovaginal exam following the transabdominal exam to visualize the
adnexal structures to better advantage.

[Series 1: us pelvis complete · 0.27mm/px · 14 of 25 slices shown]
[im 1/25]
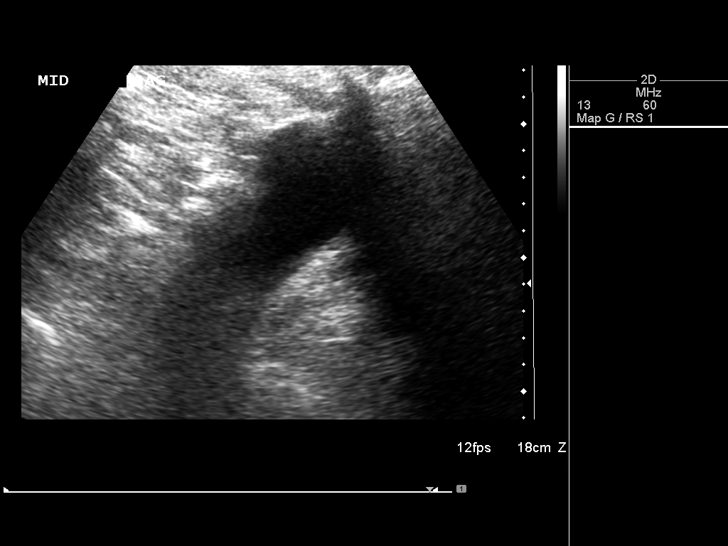
[im 3/25]
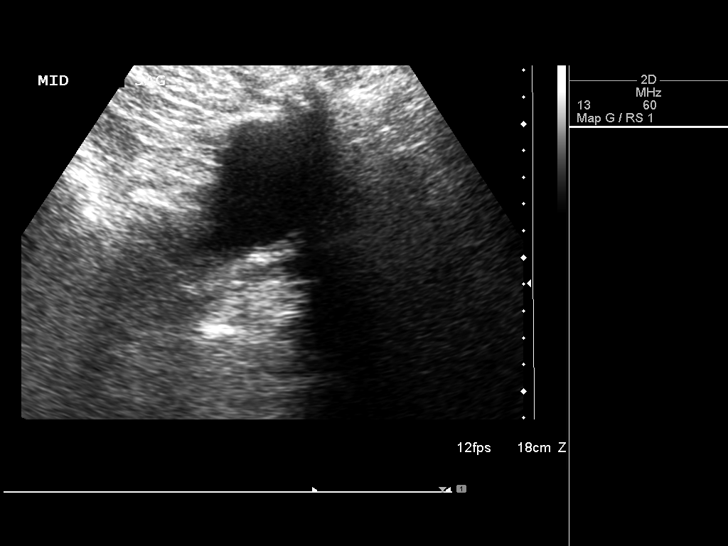
[im 5/25]
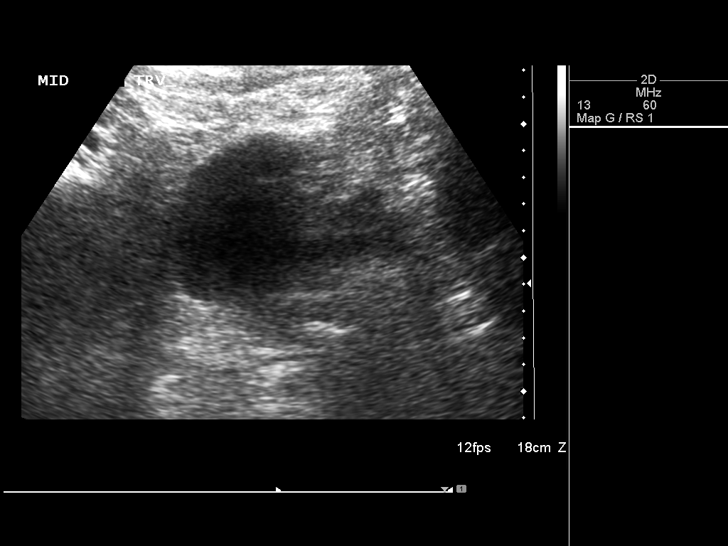
[im 7/25]
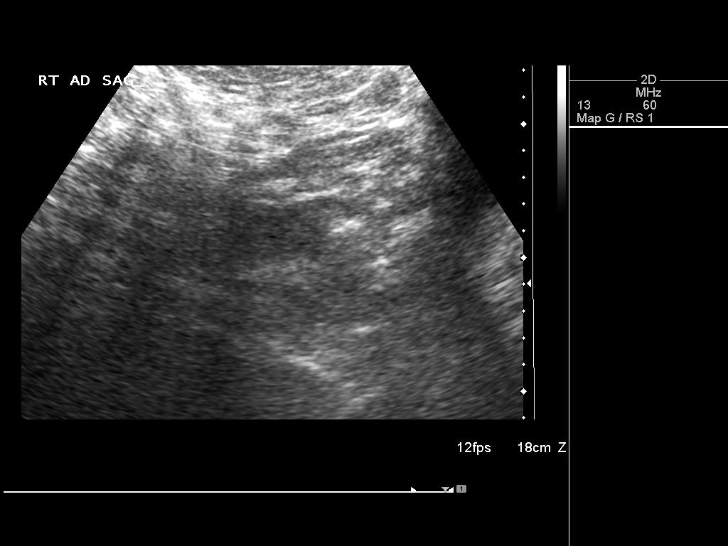
[im 9/25]
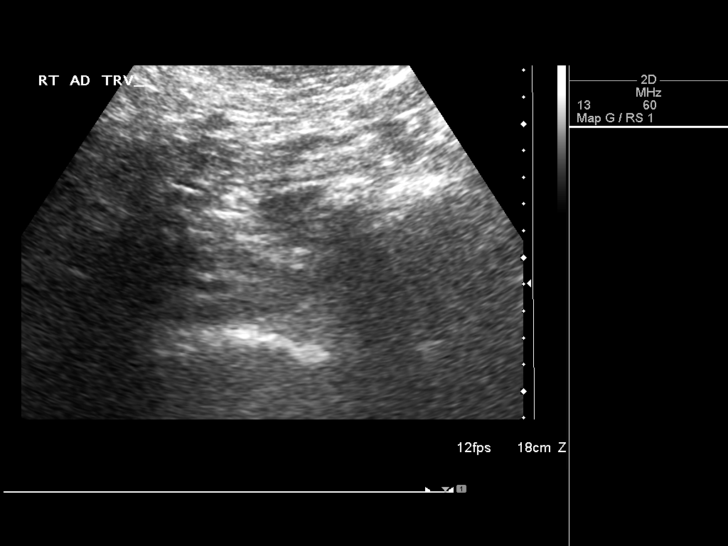
[im 10/25]
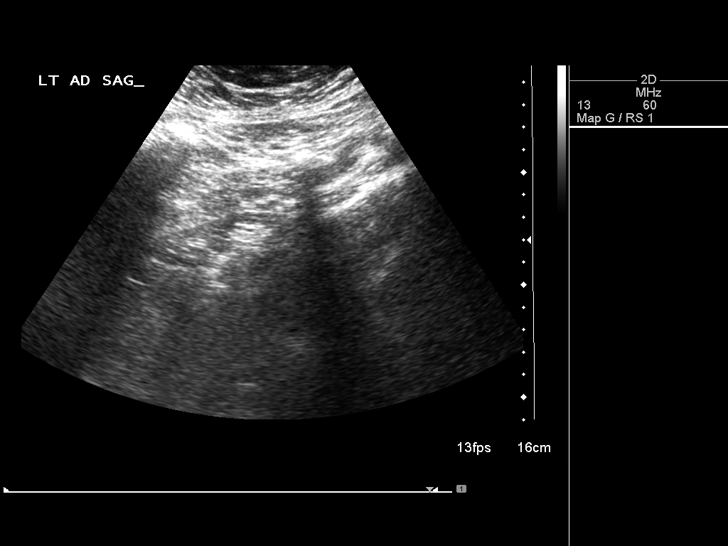
[im 12/25]
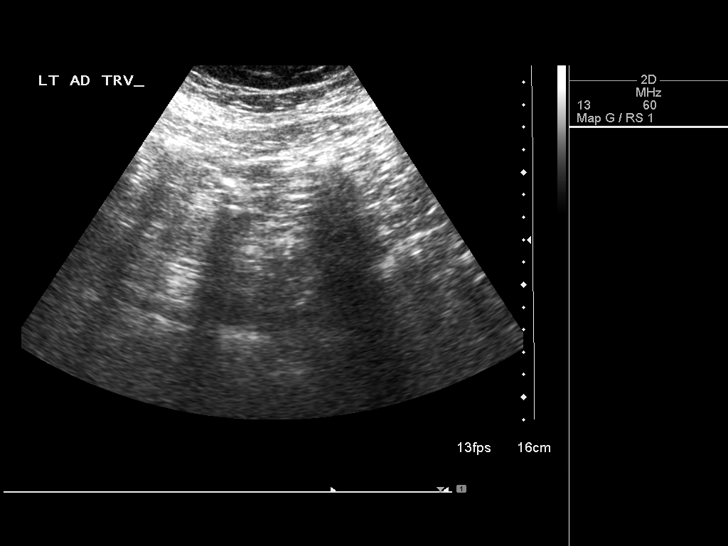
[im 14/25]
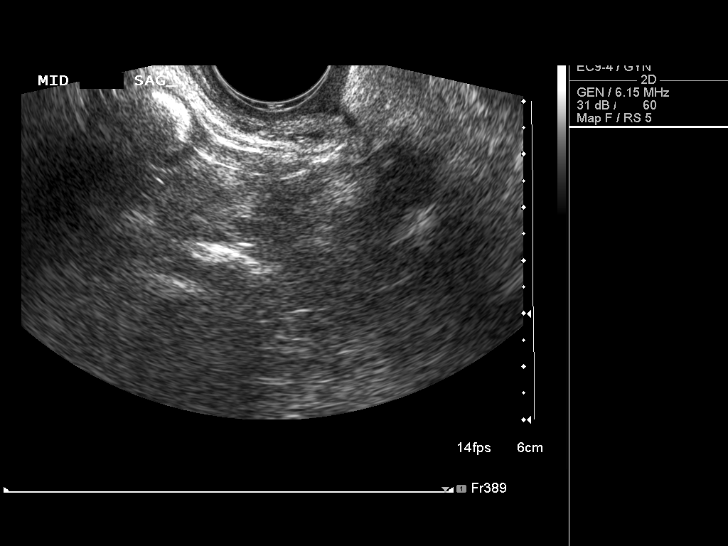
[im 16/25]
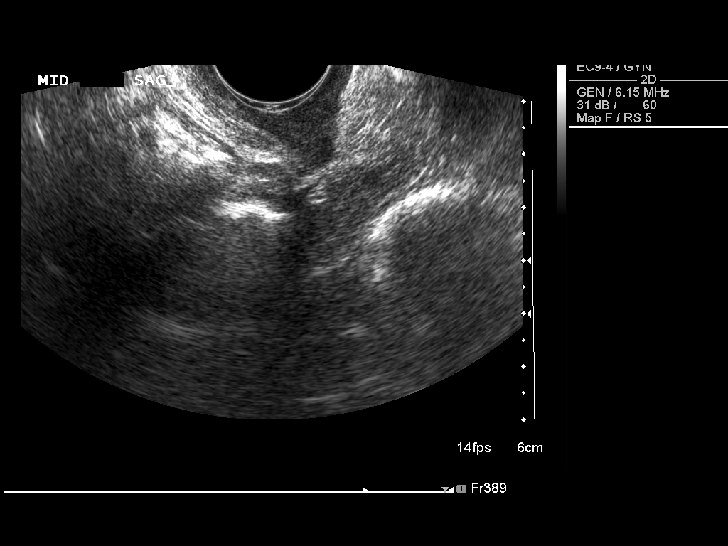
[im 17/25]
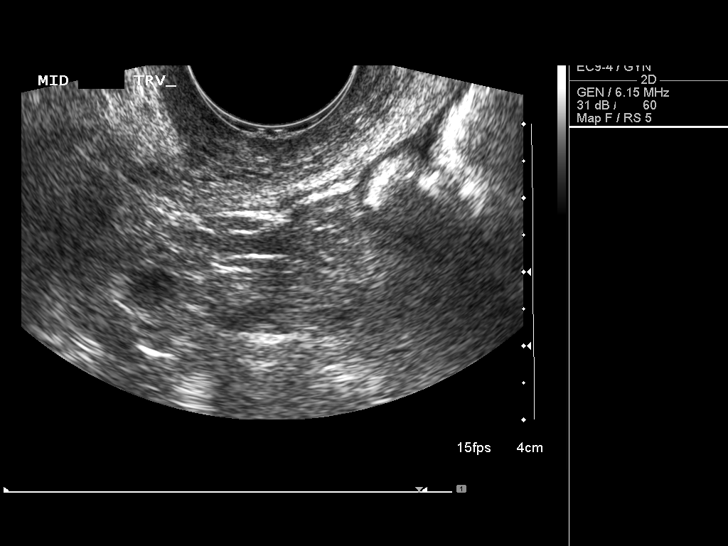
[im 19/25]
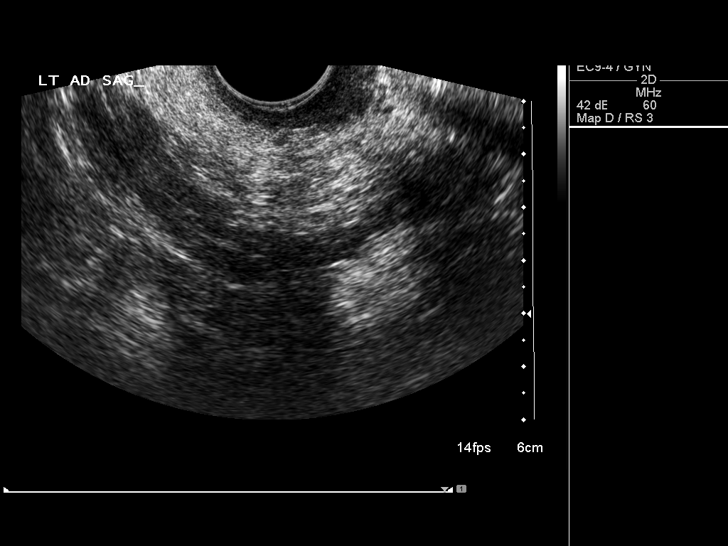
[im 21/25]
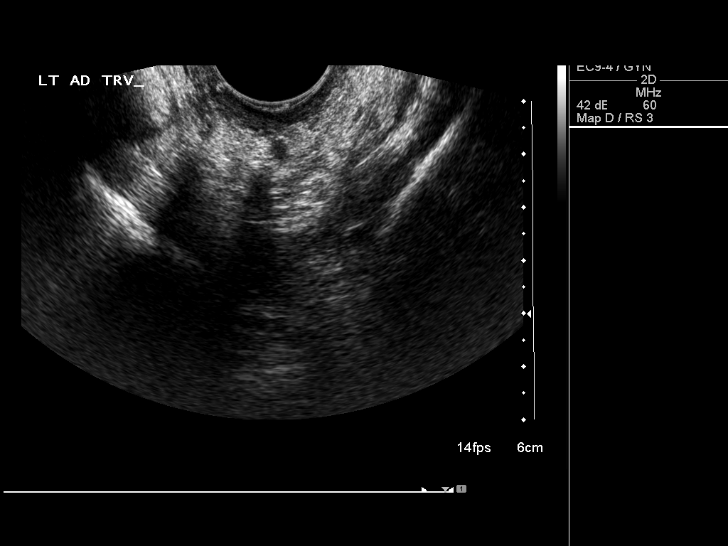
[im 23/25]
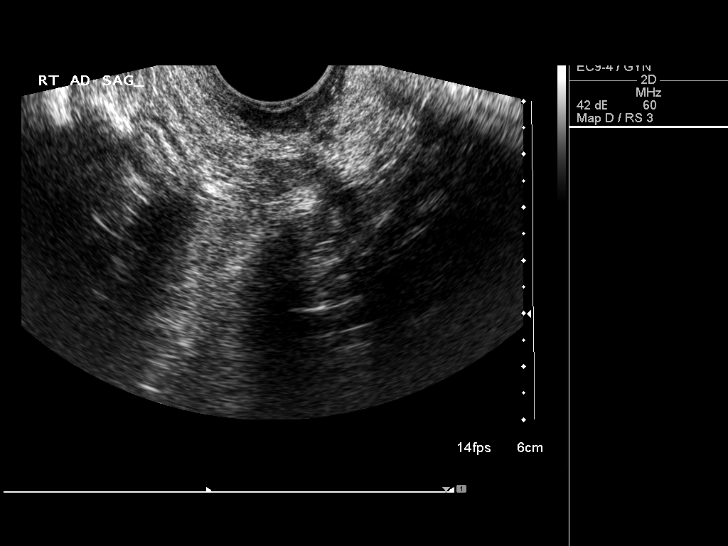
[im 25/25]
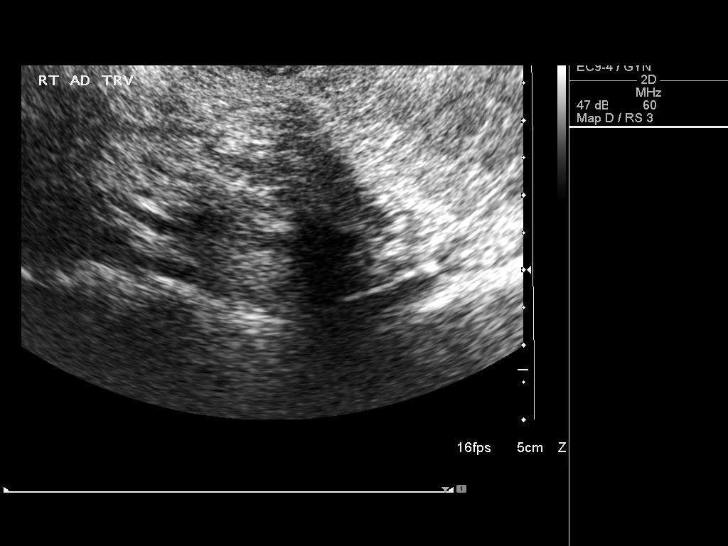

[14 of 25 positions shown; findings below may reference images not displayed]

FINDINGS: Uterus

Surgically absent

Right ovary

Surgically absent

Left ovary

Surgically absent

Other findings

There are no pelvic/ adnexal masses.  There is no pelvic free fluid.
IMPRESSION: 1. No pelvic mass or free fluid. No findings to explain pelvic pain.
2. Status post hysterectomy and bilateral salpingo-oophorectomy.

## 2014-12-01 ENCOUNTER — Other Ambulatory Visit (INDEPENDENT_AMBULATORY_CARE_PROVIDER_SITE_OTHER): Payer: Medicare Other

## 2014-12-01 ENCOUNTER — Encounter (INDEPENDENT_AMBULATORY_CARE_PROVIDER_SITE_OTHER): Payer: Self-pay

## 2014-12-01 DIAGNOSIS — R413 Other amnesia: Secondary | ICD-10-CM

## 2014-12-01 NOTE — Progress Notes (Signed)
Lab work for Dr Pearlie Oyster Lipid Dx: Teresa Lowery.3

## 2014-12-02 LAB — LIPID PANEL
CHOL/HDL RATIO: 4.6 ratio — AB (ref 0.0–4.4)
Cholesterol, Total: 236 mg/dL — ABNORMAL HIGH (ref 100–199)
HDL: 51 mg/dL (ref >39)
LDL Calculated: 153 mg/dL — ABNORMAL HIGH (ref 0–99)
Triglycerides: 160 mg/dL — ABNORMAL HIGH (ref 0–149)
VLDL Cholesterol Cal: 32 mg/dL (ref 5–40)

## 2015-08-10 ENCOUNTER — Encounter: Payer: Self-pay | Admitting: Family Medicine

## 2015-08-10 ENCOUNTER — Ambulatory Visit (INDEPENDENT_AMBULATORY_CARE_PROVIDER_SITE_OTHER): Payer: Medicare Other | Admitting: Family Medicine

## 2015-08-10 VITALS — BP 147/78 | HR 98 | Temp 98.8°F | Ht 65.5 in | Wt 168.2 lb

## 2015-08-10 DIAGNOSIS — R05 Cough: Secondary | ICD-10-CM

## 2015-08-10 DIAGNOSIS — J209 Acute bronchitis, unspecified: Secondary | ICD-10-CM

## 2015-08-10 DIAGNOSIS — R059 Cough, unspecified: Secondary | ICD-10-CM

## 2015-08-10 LAB — POCT INFLUENZA A/B
Influenza A, POC: NEGATIVE
Influenza B, POC: NEGATIVE

## 2015-08-10 MED ORDER — AZITHROMYCIN 250 MG PO TABS: ORAL_TABLET | ORAL | Status: DC

## 2015-08-10 NOTE — Progress Notes (Signed)
   HPI  Patient presents today Teresa Lowery acute illness.  She explained she's had 5-6 days of cough, nasal congestion, right ear pain, and hoarseness.  She denies any overt shortness of breath, however herbreathing does sound like it has a rattle at times. She's eating and drinking normally. Her right ear pain is been present off and on for a few weeks.  She was seen a couple months ago for similar illness and treated with Flonase which did not help.  PMH: Smoking status noted ROS: Per HPI  Objective: BP 147/78 mmHg  Pulse 98  Temp(Src) 98.8 F (37.1 C) (Oral)  Ht 5' 5.5" (1.664 m)  Wt 168 lb 3.2 oz (76.295 kg)  BMI 27.55 kg/m2 Gen: NAD, alert, cooperative with exam HEENT: NCAT, tMs normal bilaterally, nares clear, oropharynx clear CV: RRR, good S1/S2, no murmur Resp: CTABL, no wheezes, non-labored Ext: No edema, warm Neuro: Alert and oriented, No gross deficits  Assessment and plan:  # Acute bronchitis Considering age, severity of illness, and duration of illness although ahead and cover forcommunity-acquired pneumonia with azithromycin. Discussed supportive care for cough. Return to clinic with any concerns or worsening symptoms    Orders Placed This Encounter  Procedures  . POCT Influenza A/B    Meds ordered this encounter  Medications  . donepezil (ARICEPT) 10 MG tablet    Sig: Take 10 mg by mouth.  Marland Kitchen atorvastatin (LIPITOR) 20 MG tablet    Sig:   . clopidogrel (PLAVIX) 75 MG tablet    Sig: Take 75 mg by mouth.  Marland Kitchen azithromycin (ZITHROMAX) 250 MG tablet    Sig: Take 2 tablets on day 1 and 1 tablet daily after that    Dispense:  6 tablet    Refill:  0    Murtis Sink, MD [place] [facility] 08/10/2015, 6:13 PM

## 2015-08-10 NOTE — Patient Instructions (Signed)
Great to meet you!  Take all of the antibiotics  Try Robitussin DM 12 hour  Acute Bronchitis Bronchitis is when the airways that extend from the windpipe into the lungs get red, puffy, and painful (inflamed). Bronchitis often causes thick spit (mucus) to develop. This leads to a cough. A cough is the most common symptom of bronchitis. In acute bronchitis, the condition usually begins suddenly and goes away over time (usually in 2 weeks). Smoking, allergies, and asthma can make bronchitis worse. Repeated episodes of bronchitis may cause more lung problems. HOME CARE  Rest.  Drink enough fluids to keep your pee (urine) clear or pale yellow (unless you need to limit fluids as told by your doctor).  Only take over-the-counter or prescription medicines as told by your doctor.  Avoid smoking and secondhand smoke. These can make bronchitis worse. If you are a smoker, think about using nicotine gum or skin patches. Quitting smoking will help your lungs heal faster.  Reduce the chance of getting bronchitis again by:  Washing your hands often.  Avoiding people with cold symptoms.  Trying not to touch your hands to your mouth, nose, or eyes.  Follow up with your doctor as told. GET HELP IF: Your symptoms do not improve after 1 week of treatment. Symptoms include:  Cough.  Fever.  Coughing up thick spit.  Body aches.  Chest congestion.  Chills.  Shortness of breath.  Sore throat. GET HELP RIGHT AWAY IF:   You have an increased fever.  You have chills.  You have severe shortness of breath.  You have bloody thick spit (sputum).  You throw up (vomit) often.  You lose too much body fluid (dehydration).  You have a severe headache.  You faint. MAKE SURE YOU:   Understand these instructions.  Will watch your condition.  Will get help right away if you are not doing well or get worse.   This information is not intended to replace advice given to you by your health  care provider. Make sure you discuss any questions you have with your health care provider.   Document Released: 11/26/2007 Document Revised: 02/09/2013 Document Reviewed: 11/30/2012 Elsevier Interactive Patient Education [employer]

## 2016-10-01 DIAGNOSIS — Z87898 Personal history of other specified conditions: Secondary | ICD-10-CM | POA: Insufficient documentation

## 2016-10-01 DIAGNOSIS — M19041 Primary osteoarthritis, right hand: Secondary | ICD-10-CM | POA: Insufficient documentation

## 2016-10-01 DIAGNOSIS — M1990 Unspecified osteoarthritis, unspecified site: Secondary | ICD-10-CM | POA: Insufficient documentation

## 2016-10-01 DIAGNOSIS — M461 Sacroiliitis, not elsewhere classified: Secondary | ICD-10-CM | POA: Insufficient documentation

## 2016-10-01 DIAGNOSIS — M19042 Primary osteoarthritis, left hand: Principal | ICD-10-CM

## 2016-10-01 DIAGNOSIS — M47818 Spondylosis without myelopathy or radiculopathy, sacral and sacrococcygeal region: Secondary | ICD-10-CM

## 2016-10-01 DIAGNOSIS — Z8659 Personal history of other mental and behavioral disorders: Secondary | ICD-10-CM | POA: Insufficient documentation

## 2016-10-01 DIAGNOSIS — Z85038 Personal history of other malignant neoplasm of large intestine: Secondary | ICD-10-CM | POA: Insufficient documentation

## 2016-10-01 DIAGNOSIS — Z8679 Personal history of other diseases of the circulatory system: Secondary | ICD-10-CM | POA: Insufficient documentation

## 2016-10-01 DIAGNOSIS — M170 Bilateral primary osteoarthritis of knee: Secondary | ICD-10-CM | POA: Insufficient documentation

## 2016-10-01 NOTE — Progress Notes (Signed)
Office Visit Note  Patient: Teresa Lowery             Date of Birth: 01-08-1935           MRN: [id number]             PCP: Kevin Fenton, MD Referring: Elenora Gamma, MD Visit Date: 10/06/2016 Occupation: @    Subjective:  Follow-up   History of Present Illness: Teresa Lowery is a 82 y.o. female  Last seen April 2017. C SRS for full details. She comes annually. Prior to 2017, she was seen April 2016.  History of OA of bilateral hands with right fourth PIP joint of great concern to the patient. She rates her pain as 8 on a scale of 0-10. She has ongoing pain on most days which she rates about 4-5 on a scale of 0-10.  She has Voltaren gel at home but forgets to use it from time to time. No other complaints or problems at this time. He does affect her sleep at times.    Activities of Daily Living:  Patient reports morning stiffness for 30 minutes.   Patient Reports nocturnal pain.  Difficulty dressing/grooming: Reports Difficulty climbing stairs: Denies Difficulty getting out of chair: Denies Difficulty using hands for taps, buttons, cutlery, and/or writing: Reports   Review of Systems  Constitutional: Negative for fatigue.  HENT: Negative for mouth sores and mouth dryness.   Eyes: Negative for dryness.  Respiratory: Negative for shortness of breath.   Gastrointestinal: Negative for constipation and diarrhea.  Musculoskeletal: Negative for myalgias and myalgias.  Skin: Negative for sensitivity to sunlight.  Psychiatric/Behavioral: Negative for decreased concentration and sleep disturbance.    PMFS History:  Patient Active Problem List   Diagnosis Date Noted  . Primary osteoarthritis of both hands 10/01/2016  . Primary osteoarthritis of both knees 10/01/2016  . Osteoarthritis of sacroiliac joint 10/01/2016  . History of hypertension 10/01/2016  . History of anxiety 10/01/2016  . History of vertigo 10/01/2016  . Inflammatory osteoarthritis  10/01/2016  . History of colon cancer 10/01/2016    Past Medical History:  Diagnosis Date  . Cataract     No family history on file. Past Surgical History:  Procedure Laterality Date  . ABDOMINAL HYSTERECTOMY    . APPENDECTOMY    . CHOLECYSTECTOMY     Social History   Social History Narrative  . No narrative on file     Objective: Vital Signs: BP 128/78   Pulse 78   Resp 14   Ht  (1.702 m)   Wt 158 lb (71.7 kg)   BMI 24.75 kg/m    Physical Exam  Constitutional: She is oriented to person, place, and time. She appears well-developed and well-nourished.  HENT:  Head: Normocephalic and atraumatic.  Eyes: EOM are normal. Pupils are equal, round, and reactive to light.  Cardiovascular: Normal rate, regular rhythm and normal heart sounds.  Exam reveals no gallop and no friction rub.   No murmur heard. Pulmonary/Chest: Effort normal and breath sounds normal. She has no wheezes. She has no rales.  Abdominal: Soft. Bowel sounds are normal. She exhibits no distension. There is no tenderness. There is no guarding. No hernia.  Musculoskeletal: Normal range of motion. She exhibits no edema, tenderness or deformity.  Lymphadenopathy:    She has no cervical adenopathy.  Neurological: She is alert and oriented to person, place, and time. Coordination normal.  Skin: Skin is warm and dry. Capillary refill takes  less than 2 seconds. No rash noted.  Psychiatric: She has a normal mood and affect. Her behavior is normal.  Nursing note and vitals reviewed.    Musculoskeletal Exam:  Full range of motion of all joints except unable to make a full fist with her right hand. The right fourth finger at the PIP joint has poor flexibility.  CDAI Exam: CDAI Homunculus Exam:   Joint Counts:  CDAI Tender Joint count: 0 CDAI Swollen Joint count: 0  Global Assessments:  Patient Global Assessment: 6 Provider Global Assessment: 6  CDAI Calculated Score: 12    Investigation: Findings:   Labs from January 03, 2014, show CMP normal except GFR slightly low at 54.  CBC with diff is normal.  Sed rate is normal at 9.  G6PD is normal at 12.6.  Uric acid is normal at 5.7.  ACE is normal at 19.  M-spike is negative.  CCP is normal.    Labs done in April 2015 by Dr. Lysbeth Galas show sed rate, rheumatoid factor, ANA are all normal.    DEXA status is unknown    No visits with results within 6 Month(s) from this visit.  Latest known visit with results is:  Office Visit on 08/10/2015  Component Date Value Ref Range Status  . Influenza A, POC 08/10/2015 Negative  Negative Final  . Influenza B, POC 08/10/2015 Negative  Negative Final      Imaging: No results found.  Speciality Comments: No specialty comments available.    Procedures:  No procedures performed Allergies: Aspirin; Penicillins; and Sulfa antibiotics   Assessment / Plan:     Visit Diagnoses: Primary osteoarthritis of both hands  Primary osteoarthritis of both knees  Osteoarthritis of sacroiliac joint  History of hypertension  History of anxiety  History of vertigo  Inflammatory osteoarthritis  History of colon cancer - S/P resection 1/16   Plan: #1: Osteoarthritis of the hands. Right fourth PIP joint with swelling and mild warmth (??). Patient has not use Voltaren gel as prescribed before.  #2: OA of bilateral knees. Doing fairly well.  #3: Right hand pain especially right fourth PIP joint. I would like to rule out rheumatoid arthritis and scheduled patient for an ultrasound for this.  #4: Patient will use exercise, OA supplements. If these are not enough I plan to do physical therapy for the patient by sending her to the physical therapist at the [facility].  #5: Consider sending the patient to hand surgeon for evaluation and treatment if patient would like.  #6: No med refills needed at this time. Patient will call us if she needs medication. She states that she has 2 tubes of Voltaren gel at  home which she has not used yet.  #7: Schedule ultrasound of bilateral hands with the right fourth PIP joint of main concern to rule out synovitis.  #8: Return to clinic in 1 year for regular follow-up Orders: No orders of the defined types were placed in this encounter.  No orders of the defined types were placed in this encounter.   Face-to-face time spent with patient was 30 minutes. 50% of time was spent in counseling and coordination of care.  Follow-Up Instructions: Return in about 1 year (around 10/06/2017) for oa hands,feet,knes, right 4th pip swelling and pain.   Tawni Pummel, PA-C  Note - This record has been created using [employer].  Chart creation errors have been sought, but may not always  have been located. Such creation errors do not reflect  on  the standard of medical care.

## 2016-10-06 ENCOUNTER — Encounter: Payer: Self-pay | Admitting: Rheumatology

## 2016-10-06 ENCOUNTER — Ambulatory Visit (INDEPENDENT_AMBULATORY_CARE_PROVIDER_SITE_OTHER): Payer: Medicare Other | Admitting: Rheumatology

## 2016-10-06 VITALS — BP 128/78 | HR 78 | Resp 14 | Ht 67.0 in | Wt 158.0 lb

## 2016-10-06 DIAGNOSIS — Z8659 Personal history of other mental and behavioral disorders: Secondary | ICD-10-CM | POA: Diagnosis not present

## 2016-10-06 DIAGNOSIS — M1990 Unspecified osteoarthritis, unspecified site: Secondary | ICD-10-CM | POA: Diagnosis not present

## 2016-10-06 DIAGNOSIS — Z8679 Personal history of other diseases of the circulatory system: Secondary | ICD-10-CM

## 2016-10-06 DIAGNOSIS — M47818 Spondylosis without myelopathy or radiculopathy, sacral and sacrococcygeal region: Secondary | ICD-10-CM

## 2016-10-06 DIAGNOSIS — Z85038 Personal history of other malignant neoplasm of large intestine: Secondary | ICD-10-CM

## 2016-10-06 DIAGNOSIS — M170 Bilateral primary osteoarthritis of knee: Secondary | ICD-10-CM

## 2016-10-06 DIAGNOSIS — Z87898 Personal history of other specified conditions: Secondary | ICD-10-CM

## 2016-10-06 DIAGNOSIS — M461 Sacroiliitis, not elsewhere classified: Secondary | ICD-10-CM

## 2016-10-06 DIAGNOSIS — M19041 Primary osteoarthritis, right hand: Secondary | ICD-10-CM | POA: Diagnosis not present

## 2016-10-06 DIAGNOSIS — M19042 Primary osteoarthritis, left hand: Secondary | ICD-10-CM | POA: Diagnosis not present

## 2016-10-06 NOTE — Patient Instructions (Addendum)
Hand Exercises Hand exercises can be helpful to almost anyone. These exercises can strengthen the hands, improve flexibility and movement, and increase blood flow to the hands. These results can make work and daily tasks easier. Hand exercises can be especially helpful for people who have joint pain from arthritis or have nerve damage from overuse (carpal tunnel syndrome). These exercises can also help people who have injured a hand. Most of these hand exercises are fairly gentle stretching routines. You can do them often throughout the day. Still, it is a good idea to ask your health care provider which exercises would be best for you. Warming your hands before exercise may help to reduce stiffness. You can do this with gentle massage or by placing your hands in warm water for 15 minutes. Also, make sure you pay attention to your level of hand pain as you begin an exercise routine. Exercises Knuckle Bend  Repeat this exercise 5-10 times with each hand. 1. Stand or sit with your arm, hand, and all five fingers pointed straight up. Make sure your wrist is straight. 2. Gently and slowly bend your fingers down and inward until the tips of your fingers are touching the tops of your palm. 3. Hold this position for a few seconds. 4. Extend your fingers out to their original position, all pointing straight up again. Finger Fan  Repeat this exercise 5-10 times with each hand. 1. Hold your arm and hand out in front of you. Keep your wrist straight. 2. Squeeze your hand into a fist. 3. Hold this position for a few seconds. 4. Fan out, or spread apart, your hand and fingers as much as possible, stretching every joint fully. Tabletop  Repeat this exercise 5-10 times with each hand. 1. Stand or sit with your arm, hand, and all five fingers pointed straight up. Make sure your wrist is straight. 2. Gently and slowly bend your fingers at the knuckles where they meet the hand until your hand is making an  upside-down L shape. Your fingers should form a tabletop. 3. Hold this position for a few seconds. 4. Extend your fingers out to their original position, all pointing straight up again. Making Os  Repeat this exercise 5-10 times with each hand. 1. Stand or sit with your arm, hand, and all five fingers pointed straight up. Make sure your wrist is straight. 2. Make an O shape by touching your pointer finger to your thumb. Hold for a few seconds. Then open your hand wide. 3. Repeat this motion with each finger on your hand. Table Spread  Repeat this exercise 5-10 times with each hand. 1. Place your hand on a table with your palm facing down. Make sure your wrist is straight. 2. Spread your fingers out as much as possible. Hold this position for a few seconds. 3. Slide your fingers back together again. Hold for a few seconds. Ball Grip   Repeat this exercise 10-15 times with each hand. 1. Hold a tennis ball or another soft ball in your hand. 2. While slowly increasing pressure, squeeze the ball as hard as possible. 3. Squeeze as hard as you can for 3-5 seconds. 4. Relax and repeat. Wrist Curls  Repeat this exercise 10-15 times with each hand. 1. Sit in a chair that has armrests. 2. Hold a light weight in your hand, such as a dumbbell that weighs 1-3 pounds (0.5-1.4 kg). Ask your health care provider what weight would be best for you. 3. Rest your hand just   over the end of the chair arm with your palm facing up. 4. Gently pivot your wrist up and down while holding the weight. Do not twist your wrist from side to side. Contact a health care provider if:  Your hand pain or discomfort gets much worse when you do an exercise.  Your hand pain or discomfort does not improve within 2 hours after you exercise. If you have any of these problems, stop doing these exercises right away. Do not do them again unless your health care provider says that you can. Get help right away if:  You develop  sudden, severe hand pain. If this happens, stop doing these exercises right away. Do not do them again unless your health care provider says that you can. This information is not intended to replace advice given to you by your health care provider. Make sure you discuss any questions you have with your health care provider. Document Released: 05/21/2015 Document Revised: 11/15/2015 Document Reviewed: 12/18/2014 Elsevier Interactive Patient Education  2017 Elsevier Inc. Supplements for OA Natural anti-inflammatories  You can purchase these at [employer], [employer] or online.  . Turmeric (capsules)  . Ginger (ginger root or capsules)  . Omega 3 (Fish, flax seeds, chia seeds, walnuts, almonds)  . Tart cherry (dried or extract)   Patient should be under the care of a physician while taking these supplements. This may not be reproduced without the permission of Dr. Shaili Deveshwar.  

## 2016-11-21 NOTE — Progress Notes (Signed)
  Assessment / Plan:     Visit Diagnoses: Primary osteoarthritis of both hands  Primary osteoarthritis of both knees  Osteoarthritis of sacroiliac joint  History of hypertension  History of anxiety  History of vertigo  Inflammatory osteoarthritis  History of colon cancer - S/P resection 1/16   Plan: #1: Osteoarthritis of the hands. Right fourth PIP joint with swelling and mild warmth (??). Patient has not use Voltaren gel as prescribed before.  #2: OA of bilateral knees. Doing fairly well.  #3: Right hand pain especially right fourth PIP joint. I would like to rule out rheumatoid arthritis and scheduled patient for an ultrasound for this.  #4: Patient will use exercise, OA supplements. If these are not enough I plan to do physical therapy for the patient by sending her [facility].  #5: Consider sending the patient to hand surgeon for evaluation and treatment if patient would like.  #6: No med refills needed at this time. Patient will call us if she needs medication. She states that she has 2 tubes of Voltaren gel at home which she has not used yet.  #7: Schedule ultrasound of bilateral hands with the right fourth PIP joint of main concern to rule out synovitis.  #8: Return to clinic in 1 year for regular follow-up Orders: No orders of the defined types were placed in this encounter.  No orders of the defined types were placed in this encounter.

## 2016-11-26 ENCOUNTER — Ambulatory Visit (INDEPENDENT_AMBULATORY_CARE_PROVIDER_SITE_OTHER): Payer: Medicare Other | Admitting: Rheumatology

## 2016-11-26 ENCOUNTER — Inpatient Hospital Stay (INDEPENDENT_AMBULATORY_CARE_PROVIDER_SITE_OTHER): Payer: Medicare Other

## 2016-11-26 DIAGNOSIS — M79641 Pain in right hand: Secondary | ICD-10-CM

## 2016-11-26 DIAGNOSIS — M79642 Pain in left hand: Principal | ICD-10-CM

## 2017-05-15 NOTE — Progress Notes (Deleted)
   Office Visit Note  Patient: Teresa Lowery             Date of Birth: Nov 03, 1934           MRN: [id number]             PCP: Joette CatchingNyland, Leonard, MD Referring: Joette CatchingNyland, Leonard, MD Visit Date: 05/28/2017 Occupation: @GUAROCC @    Subjective:  No chief complaint on file.   History of Present Illness: Teresa Sonsancy M Locker is a 82 y.o. female ***   Activities of Daily Living:  Patient reports morning stiffness for *** {minute/hour:19697}.   Patient {ACTIONS;DENIES/REPORTS:21021675::"Denies"} nocturnal pain.  Difficulty dressing/grooming: {ACTIONS;DENIES/REPORTS:21021675::"Denies"} Difficulty climbing stairs: {ACTIONS;DENIES/REPORTS:21021675::"Denies"} Difficulty getting out of chair: {ACTIONS;DENIES/REPORTS:21021675::"Denies"} Difficulty using hands for taps, buttons, cutlery, and/or writing: {ACTIONS;DENIES/REPORTS:21021675::"Denies"}   No Rheumatology ROS completed.   PMFS History:  Patient Active Problem List   Diagnosis Date Noted  . Primary osteoarthritis of both hands 10/01/2016  . Primary osteoarthritis of both knees 10/01/2016  . Osteoarthritis of sacroiliac joint 10/01/2016  . History of hypertension 10/01/2016  . History of anxiety 10/01/2016  . History of vertigo 10/01/2016  . Inflammatory osteoarthritis 10/01/2016  . History of colon cancer 10/01/2016    Past Medical History:  Diagnosis Date  . Cataract     No family history on file. Past Surgical History:  Procedure Laterality Date  . ABDOMINAL HYSTERECTOMY    . APPENDECTOMY    . CHOLECYSTECTOMY     Social History   Social History Narrative  . Not on file     Objective: Vital Signs: There were no vitals taken for this visit.   Physical Exam   Musculoskeletal Exam: ***  CDAI Exam: No CDAI exam completed.    Investigation: No additional findings.   Imaging: No results found.  Speciality Comments: No specialty comments available.    Procedures:  No procedures performed Allergies: Aspirin;  Penicillins; and Sulfa antibiotics   Assessment / Plan:     Visit Diagnoses: No diagnosis found.    Orders: No orders of the defined types were placed in this encounter.  No orders of the defined types were placed in this encounter.   Face-to-face time spent with patient was *** minutes. 50% of time was spent in counseling and coordination of care.  Follow-Up Instructions: No Follow-up on file.   Ellen HenriMarissa C [place] Mullett, CMA  Note - This record has been created using [employer] software.  Chart creation errors have been sought, but may not always  have been located. Such creation errors do not reflect on  the standard of medical care.

## 2017-05-28 ENCOUNTER — Ambulatory Visit: Payer: Medicare Other | Admitting: Rheumatology

## 2017-10-05 ENCOUNTER — Ambulatory Visit: Payer: Medicare Other | Admitting: Rheumatology

## 2021-10-21 DEATH — deceased
